# Patient Record
Sex: Female | Born: 1990 | Hispanic: No | Marital: Married | Smoking: Never smoker
Health system: Southern US, Community
[De-identification: ages and names within clinical notes are randomized; demographics above are authoritative.]

## PROBLEM LIST (undated history)

## (undated) ENCOUNTER — Inpatient Hospital Stay (HOSPITAL_COMMUNITY): Payer: Self-pay

## (undated) DIAGNOSIS — Z789 Other specified health status: Secondary | ICD-10-CM

## (undated) DIAGNOSIS — J302 Other seasonal allergic rhinitis: Secondary | ICD-10-CM

## (undated) DIAGNOSIS — J189 Pneumonia, unspecified organism: Secondary | ICD-10-CM

## (undated) DIAGNOSIS — Z6791 Unspecified blood type, Rh negative: Secondary | ICD-10-CM

## (undated) DIAGNOSIS — O26899 Other specified pregnancy related conditions, unspecified trimester: Secondary | ICD-10-CM

## (undated) DIAGNOSIS — O2610 Low weight gain in pregnancy, unspecified trimester: Secondary | ICD-10-CM

## (undated) HISTORY — DX: Low weight gain in pregnancy, unspecified trimester: O26.10

## (undated) HISTORY — PX: NO PAST SURGERIES: SHX2092

## (undated) HISTORY — DX: Unspecified blood type, rh negative: Z67.91

## (undated) HISTORY — DX: Other specified pregnancy related conditions, unspecified trimester: O26.899

---

## 2016-02-04 NOTE — L&D Delivery Note (Signed)
Patient is 26 y.o. G2P1001 2543w3d admitted SOL. S/p augmentation with Pitocin and AROM.  Prenatal course also complicated by Rh negative status and female circumscision.  Delivery Note At 7:33 PM a viable female was delivered via Vaginal, Spontaneous (Presentation: ROA ).  APGAR: 8, 9; weight pending.   Placenta status: Intact.  Cord: 3V  with the following complications: None.  Cord pH: N/A  Anesthesia: Epidural Episiotomy: Median Lacerations: 2nd degree, labial Suture Repair: vicryl monocryl Est. Blood Loss (mL):  500mL  Head was crowing so decision was made to cut Type 3 female circumcision vertically about 1cm. Head still couldn't deliver so decision was made to do a medial episiotomy. Head delivered ROA.  Loose nuchal cord x1 present. Shoulder and body delivered in usual fashion. Infant with spontaneous cry, placed on mother's abdomen, dried and bulb suctioned. Cord clamped x 2 after 1-minute delay, and cut. Cord blood drawn. Placenta delivered spontaneously with gentle cord traction. Fundus firm with massage and Pitocin. Patient continued to have bleeding so Cytotec 1000mg  rectally given with good hemostasis after. Perineum inspected and found to have 2nd degree laceration along episiotomy cut. Also repaired female circumscision after cut. Repaired with good hemostasis achieved.   Caryl AdaJazma Kenyatta Keidel, DO OB Fellow

## 2016-07-05 ENCOUNTER — Inpatient Hospital Stay (HOSPITAL_COMMUNITY): Payer: Medicaid Other

## 2016-07-05 ENCOUNTER — Encounter (HOSPITAL_COMMUNITY): Payer: Self-pay

## 2016-07-05 ENCOUNTER — Inpatient Hospital Stay (HOSPITAL_COMMUNITY)
Admission: AD | Admit: 2016-07-05 | Discharge: 2016-07-05 | Disposition: A | Discharge status: Home / Self Care | Payer: Medicaid Other | Source: Ambulatory Visit | Attending: Obstetrics & Gynecology | Admitting: Obstetrics & Gynecology

## 2016-07-05 DIAGNOSIS — O219 Vomiting of pregnancy, unspecified: Secondary | ICD-10-CM | POA: Insufficient documentation

## 2016-07-05 DIAGNOSIS — O26891 Other specified pregnancy related conditions, first trimester: Secondary | ICD-10-CM | POA: Insufficient documentation

## 2016-07-05 DIAGNOSIS — Z3A09 9 weeks gestation of pregnancy: Secondary | ICD-10-CM | POA: Diagnosis not present

## 2016-07-05 DIAGNOSIS — R109 Unspecified abdominal pain: Secondary | ICD-10-CM | POA: Diagnosis not present

## 2016-07-05 DIAGNOSIS — Z3491 Encounter for supervision of normal pregnancy, unspecified, first trimester: Secondary | ICD-10-CM

## 2016-07-05 DIAGNOSIS — O9989 Other specified diseases and conditions complicating pregnancy, childbirth and the puerperium: Secondary | ICD-10-CM | POA: Diagnosis not present

## 2016-07-05 DIAGNOSIS — O26899 Other specified pregnancy related conditions, unspecified trimester: Secondary | ICD-10-CM

## 2016-07-05 DIAGNOSIS — O2341 Unspecified infection of urinary tract in pregnancy, first trimester: Secondary | ICD-10-CM

## 2016-07-05 HISTORY — DX: Other specified health status: Z78.9

## 2016-07-05 LAB — URINALYSIS, ROUTINE W REFLEX MICROSCOPIC
BILIRUBIN URINE: NEGATIVE
GLUCOSE, UA: NEGATIVE mg/dL
Ketones, ur: NEGATIVE mg/dL
NITRITE: NEGATIVE
Protein, ur: 100 mg/dL — AB
SPECIFIC GRAVITY, URINE: 1.025 (ref 1.005–1.030)
pH: 6 (ref 5.0–8.0)

## 2016-07-05 LAB — CBC
HEMATOCRIT: 36.6 % (ref 36.0–46.0)
HEMOGLOBIN: 12.9 g/dL (ref 12.0–15.0)
MCH: 28.7 pg (ref 26.0–34.0)
MCHC: 35.2 g/dL (ref 30.0–36.0)
MCV: 81.3 fL (ref 78.0–100.0)
Platelets: 309 10*3/uL (ref 150–400)
RBC: 4.5 MIL/uL (ref 3.87–5.11)
RDW: 12.9 % (ref 11.5–15.5)
WBC: 11.9 10*3/uL — ABNORMAL HIGH (ref 4.0–10.5)

## 2016-07-05 LAB — POCT PREGNANCY, URINE: PREG TEST UR: POSITIVE — AB

## 2016-07-05 LAB — HCG, QUANTITATIVE, PREGNANCY: hCG, Beta Chain, Quant, S: 81042 m[IU]/mL — ABNORMAL HIGH (ref <5)

## 2016-07-05 LAB — ABO/RH: ABO/RH(D): AB NEG

## 2016-07-05 MED ORDER — CEPHALEXIN 500 MG PO CAPS: 500.0000 mg | ORAL_CAPSULE | Freq: Four times a day (QID) | ORAL | 0 refills | Status: DC

## 2016-07-05 MED ORDER — PREPLUS 27-1 MG PO TABS: 1.0000 | ORAL_TABLET | Freq: Every day | ORAL | 13 refills | Status: DC

## 2016-07-05 MED ORDER — PROMETHAZINE HCL 25 MG PO TABS: 25.0000 mg | ORAL_TABLET | Freq: Four times a day (QID) | ORAL | 0 refills | Status: DC | PRN

## 2016-07-05 NOTE — Discharge Instructions (Signed)

## 2016-07-05 NOTE — MAU Provider Note (Signed)
History     CSN: [id number]  Arrival date and time: 07/05/16 1358  First Provider Initiated Contact with Patient 07/05/16 1508      Chief Complaint  Patient presents with  . Abdominal Pain   HPI Madison Bishop is a 26 y.o. G2P1001 at [redacted]w[redacted]d by unsure LMP who presents with abdominal pain. Symptoms began this morning. Reports intermittent lower abdominal paint that she describes as sharp. Rates pain 8/10. Has not treated. Also reports nausea & vomiting since finding out she was pregnant; has vomited twice today. Burning with urination for the last few days. Denies hematuria, vaginal bleeding, vaginal discharge, urinary frequency, diarrhea, or constipation.   OB History    Gravida Para Term Preterm AB Living   2 1 1     1    SAB TAB Ectopic Multiple Live Births           1      Past Medical History:  Diagnosis Date  . Medical history non-contributory     Past Surgical History:  Procedure Laterality Date  . NO PAST SURGERIES      No family history on file.  Social History  Substance Use Topics  . Smoking status: Never Smoker  . Smokeless tobacco: Never Used  . Alcohol use No    Allergies: No Known Allergies  Prescriptions Prior to Admission  Medication Sig Dispense Refill Last Dose  . acetaminophen (TYLENOL) 500 MG tablet Take 1,000 mg by mouth every 6 (six) hours as needed for mild pain, moderate pain, fever or headache.   Past Week at Unknown time    Review of Systems  Constitutional: Negative.   Gastrointestinal: Positive for abdominal pain, nausea and vomiting. Negative for constipation and diarrhea.  Genitourinary: Positive for dysuria. Negative for hematuria, vaginal bleeding and vaginal discharge.   Physical Exam   Blood pressure (!) 114/59, pulse 90, temperature 99.1 F (37.3 C), temperature source Oral, resp. rate 16, last menstrual period 05/03/2016.  Physical Exam  Nursing note and vitals reviewed. Constitutional: She is oriented to person,  place, and time. She appears well-developed and well-nourished. No distress.  HENT:  Head: Normocephalic and atraumatic.  Eyes: Conjunctivae are normal. Right eye exhibits no discharge. Left eye exhibits no discharge. No scleral icterus.  Neck: Normal range of motion.  Respiratory: Effort normal. No respiratory distress.  GI: Soft. Bowel sounds are normal. She exhibits no distension. There is no tenderness. There is no rebound and no guarding.  Neurological: She is alert and oriented to person, place, and time.  Skin: Skin is warm and dry. She is not diaphoretic.  Psychiatric: She has a normal mood and affect. Her behavior is normal. Judgment and thought content normal.    MAU Course  Procedures Results for orders placed or performed during the hospital encounter of 07/05/16 (from the past 24 hour(s))  Urinalysis, Routine w reflex microscopic     Status: Abnormal   Collection Time: 07/05/16  2:40 PM  Result Value Ref Range   Color, Urine AMBER (A) YELLOW   APPearance CLOUDY (A) CLEAR   Specific Gravity, Urine 1.025 1.005 - 1.030   pH 6.0 5.0 - 8.0   Glucose, UA NEGATIVE NEGATIVE mg/dL   Hgb urine dipstick SMALL (A) NEGATIVE   Bilirubin Urine NEGATIVE NEGATIVE   Ketones, ur NEGATIVE NEGATIVE mg/dL   Protein, ur 409 (A) NEGATIVE mg/dL   Nitrite NEGATIVE NEGATIVE   Leukocytes, UA SMALL (A) NEGATIVE   RBC / HPF 6-30 0 - 5  RBC/hpf   WBC, UA 6-30 0 - 5 WBC/hpf   Bacteria, UA MANY (A) NONE SEEN   Squamous Epithelial / LPF 6-30 (A) NONE SEEN   Mucous PRESENT   Pregnancy, urine POC     Status: Abnormal   Collection Time: 07/05/16  2:47 PM  Result Value Ref Range   Preg Test, Ur POSITIVE (A) NEGATIVE  CBC     Status: Abnormal   Collection Time: 07/05/16  3:32 PM  Result Value Ref Range   WBC 11.9 (H) 4.0 - 10.5 K/uL   RBC 4.50 3.87 - 5.11 MIL/uL   Hemoglobin 12.9 12.0 - 15.0 g/dL   HCT 16.136.6 09.636.0 - 04.546.0 %   MCV 81.3 78.0 - 100.0 fL   MCH 28.7 26.0 - 34.0 pg   MCHC 35.2 30.0 - 36.0  g/dL   RDW 40.912.9 81.111.5 - 91.415.5 %   Platelets 309 150 - 400 K/uL  ABO/Rh     Status: None (Preliminary result)   Collection Time: 07/05/16  3:32 PM  Result Value Ref Range   ABO/RH(D) AB NEG   hCG, quantitative, pregnancy     Status: Abnormal   Collection Time: 07/05/16  3:32 PM  Result Value Ref Range   hCG, Beta Chain, Quant, S 81,042 (H) <5 mIU/mL   [place] Ob Comp Less 14 Wks  Result Date: 07/05/2016 CLINICAL DATA:  Pregnant patient with abdominal pain. EXAM: OBSTETRIC <14 WK ULTRASOUND TECHNIQUE: Transabdominal ultrasound was performed for evaluation of the gestation as well as the maternal uterus and adnexal regions. COMPARISON:  None. FINDINGS: Intrauterine gestational sac: Single Yolk sac:  Visualized. Embryo:  Visualized. Cardiac Activity: Visualized. Heart Rate: 176 bpm CRL:   40  mm   10 w 6 d                  US EDC: 01/25/2017 Subchorionic hemorrhage:  None visualized. Maternal uterus/adnexae: Normal right and left ovaries. No free fluid in the pelvis. No subchorionic hemorrhage. IMPRESSION: Single live intrauterine gestation.  No subchorionic hemorrhage. Electronically Signed   By: Annia Beltrew  Davis M.D.   On: 07/05/2016 16:18    MDM +UPT UA, CBC, ABO/Rh, quant hCG, HIV, and US today to rule out ectopic pregnancy Ultrasound shows SIUP with cardiac activity Assessment and Plan  A:  1. Normal IUP (intrauterine pregnancy) on prenatal ultrasound, first trimester   2. Abdominal pain affecting pregnancy   3. UTI (urinary tract infection) during pregnancy, first trimester   4. Nausea and vomiting during pregnancy prior to [redacted] weeks gestation    P: Discharge home Rx phenergan, keflex, PNV Discussed reasons to return to MAU Keep follow up appointment with OB/PCP  Urine culture pending   Madison Bishop 07/05/2016, 3:08 PM

## 2016-07-05 NOTE — MAU Note (Signed)
Pt reports severe abdominal pain since this morning; denies bleeding; fasting for [place]

## 2016-07-07 LAB — CULTURE, OB URINE: CULTURE: NO GROWTH

## 2016-07-21 ENCOUNTER — Other Ambulatory Visit (HOSPITAL_COMMUNITY)
Admission: RE | Admit: 2016-07-21 | Discharge: 2016-07-21 | Disposition: A | Discharge status: Home / Self Care | Payer: Medicaid Other | Source: Ambulatory Visit | Attending: Obstetrics and Gynecology | Admitting: Obstetrics and Gynecology

## 2016-07-21 ENCOUNTER — Ambulatory Visit (INDEPENDENT_AMBULATORY_CARE_PROVIDER_SITE_OTHER): Payer: Medicaid Other | Admitting: Obstetrics and Gynecology

## 2016-07-21 DIAGNOSIS — Z6791 Unspecified blood type, Rh negative: Secondary | ICD-10-CM

## 2016-07-21 DIAGNOSIS — O26899 Other specified pregnancy related conditions, unspecified trimester: Secondary | ICD-10-CM

## 2016-07-21 DIAGNOSIS — N90810 Female genital mutilation status, unspecified: Secondary | ICD-10-CM

## 2016-07-21 DIAGNOSIS — Z789 Other specified health status: Secondary | ICD-10-CM

## 2016-07-21 DIAGNOSIS — O09899 Supervision of other high risk pregnancies, unspecified trimester: Secondary | ICD-10-CM

## 2016-07-21 DIAGNOSIS — Z3480 Encounter for supervision of other normal pregnancy, unspecified trimester: Secondary | ICD-10-CM | POA: Diagnosis present

## 2016-07-21 HISTORY — DX: Unspecified blood type, rh negative: Z67.91

## 2016-07-21 HISTORY — DX: Other specified pregnancy related conditions, unspecified trimester: O26.899

## 2016-07-21 NOTE — Progress Notes (Signed)
  Subjective:    Madison Bishop is a G2P1001 8034w1d being seen today for her first obstetrical visit.  Her obstetrical history is significant for previous [place]complicated pregnancy in [place]. Patient does intend to breast feed. Pregnancy history fully reviewed.  Patient reports no complaints.  Vitals:   07/21/16 1444  BP: 96/61  Pulse: 90  Weight: 156 lb (70.8 kg)    HISTORY: OB History  Gravida Para Term Preterm AB Living  2 1 1     1   SAB TAB Ectopic Multiple Live Births          1    # Outcome Date GA Lbr Len/2nd Weight Sex Delivery Anes PTL Lv  2 Current           1 Term 2013     Vag-Spont   LIV     Past Medical History:  Diagnosis Date  . Medical history non-contributory    Past Surgical History:  Procedure Laterality Date  . NO PAST SURGERIES     No family history on file.   Exam    Uterus:   14-week size  Pelvic Exam:    Perineum: No Hemorrhoids, Normal Perineum   Vulva: normal   Vagina:  normal mucosa, normal discharge   pH:    Cervix: multiparous appearance and cervix is closed and long   Adnexa: normal adnexa and no mass, fullness, tenderness   Bony Pelvis: gynecoid  System: Breast:  normal appearance, no masses or tenderness   Skin: normal coloration and turgor, no rashes    Neurologic: oriented, no focal deficits   Extremities: normal strength, tone, and muscle mass   HEENT extra ocular movement intact   Mouth/Teeth mucous membranes moist, pharynx normal without lesions and dental hygiene good   Neck supple and no masses   Cardiovascular: regular rate and rhythm   Respiratory:  chest clear, no wheezing, crepitations, rhonchi, normal symmetric air entry   Abdomen: soft, non-tender; bowel sounds normal; no masses,  no organomegaly   Urinary:       Assessment:    Pregnancy: G2P1001 Patient Active Problem List   Diagnosis Date Noted  . Encounter for supervision of other normal pregnancy, unspecified trimester 07/21/2016  . Rh  negative, antepartum 07/21/2016        Plan:     Initial labs drawn. Prenatal vitamins. Problem list reviewed and updated. Genetic Screening discussed Quad Screen: requested.  Ultrasound discussed; fetal survey: ordered. Patient reports normal pap smear in February with health department- records requested  Follow up in 4 weeks. Arabic interpreter used 50% of 30 min visit spent on counseling and coordination of care.     Jobie Popp 07/21/2016

## 2016-07-21 NOTE — Progress Notes (Signed)
Last Pap 03/2016 Normal

## 2016-07-21 NOTE — Patient Instructions (Addendum)
 Second Trimester of Pregnancy The second trimester is from week 14 through week 27 (months 4 through 6). The second trimester is often a time when you feel your best. Your body has adjusted to being pregnant, and you begin to feel better physically. Usually, morning sickness has lessened or quit completely, you may have more energy, and you may have an increase in appetite. The second trimester is also a time when the fetus is growing rapidly. At the end of the sixth month, the fetus is about 9 inches long and weighs about 1 pounds. You will likely begin to feel the baby move (quickening) between 16 and 20 weeks of pregnancy. Body changes during your second trimester Your body continues to go through many changes during your second trimester. The changes vary from woman to woman.  Your weight will continue to increase. You will notice your lower abdomen bulging out.  You may begin to get stretch marks on your hips, abdomen, and breasts.  You may develop headaches that can be relieved by medicines. The medicines should be approved by your health care provider.  You may urinate more often because the fetus is pressing on your bladder.  You may develop or continue to have heartburn as a result of your pregnancy.  You may develop constipation because certain hormones are causing the muscles that push waste through your intestines to slow down.  You may develop hemorrhoids or swollen, bulging veins (varicose veins).  You may have back pain. This is caused by: ? Weight gain. ? Pregnancy hormones that are relaxing the joints in your pelvis. ? A shift in weight and the muscles that support your balance.  Your breasts will continue to grow and they will continue to become tender.  Your gums may bleed and may be sensitive to brushing and flossing.  Dark spots or blotches (chloasma, mask of pregnancy) may develop on your face. This will likely fade after the baby is born.  A dark line from  your belly button to the pubic area (linea nigra) may appear. This will likely fade after the baby is born.  You may have changes in your hair. These can include thickening of your hair, rapid growth, and changes in texture. Some women also have hair loss during or after pregnancy, or hair that feels dry or thin. Your hair will most likely return to normal after your baby is born.  What to expect at prenatal visits During a routine prenatal visit:  You will be weighed to make sure you and the fetus are growing normally.  Your blood pressure will be taken.  Your abdomen will be measured to track your baby's growth.  The fetal heartbeat will be listened to.  Any test results from the previous visit will be discussed.  Your health care provider may ask you:  How you are feeling.  If you are feeling the baby move.  If you have had any abnormal symptoms, such as leaking fluid, bleeding, severe headaches, or abdominal cramping.  If you are using any tobacco products, including cigarettes, chewing tobacco, and electronic cigarettes.  If you have any questions.  Other tests that may be performed during your second trimester include:  Blood tests that check for: ? Low iron levels (anemia). ? High blood sugar that affects pregnant women (gestational diabetes) between 24 and 28 weeks. ? Rh antibodies. This is to check for a protein on red blood cells (Rh factor).  Urine tests to check for infections, diabetes,   or protein in the urine.  An ultrasound to confirm the proper growth and development of the baby.  An amniocentesis to check for possible genetic problems.  Fetal screens for spina bifida and Down syndrome.  HIV (human immunodeficiency virus) testing. Routine prenatal testing includes screening for HIV, unless you choose not to have this test.  Follow these instructions at home: Medicines  Follow your health care provider's instructions regarding medicine use. Specific  medicines may be either safe or unsafe to take during pregnancy.  Take a prenatal vitamin that contains at least 600 micrograms (mcg) of folic acid.  If you develop constipation, try taking a stool softener if your health care provider approves. Eating and drinking  Eat a balanced diet that includes fresh fruits and vegetables, whole grains, good sources of protein such as meat, eggs, or tofu, and low-fat dairy. Your health care provider will help you determine the amount of weight gain that is right for you.  Avoid raw meat and uncooked cheese. These carry germs that can cause birth defects in the baby.  If you have low calcium intake from food, talk to your health care provider about whether you should take a daily calcium supplement.  Limit foods that are high in fat and processed sugars, such as fried and sweet foods.  To prevent constipation: ? Drink enough fluid to keep your urine clear or pale yellow. ? Eat foods that are high in fiber, such as fresh fruits and vegetables, whole grains, and beans. Activity  Exercise only as directed by your health care provider. Most women can continue their usual exercise routine during pregnancy. Try to exercise for 30 minutes at least 5 days a week. Stop exercising if you experience uterine contractions.  Avoid heavy lifting, wear low heel shoes, and practice good posture.  A sexual relationship may be continued unless your health care provider directs you otherwise. Relieving pain and discomfort  Wear a good support bra to prevent discomfort from breast tenderness.  Take warm sitz baths to soothe any pain or discomfort caused by hemorrhoids. Use hemorrhoid cream if your health care provider approves.  Rest with your legs elevated if you have leg cramps or low back pain.  If you develop varicose veins, wear support hose. Elevate your feet for 15 minutes, 3-4 times a day. Limit salt in your diet. Prenatal Care  Write down your questions.  Take them to your prenatal visits.  Keep all your prenatal visits as told by your health care provider. This is important. Safety  Wear your seat belt at all times when driving.  Make a list of emergency phone numbers, including numbers for family, friends, the hospital, and police and fire departments. General instructions  Ask your health care provider for a referral to a local prenatal education class. Begin classes no later than the beginning of month 6 of your pregnancy.  Ask for help if you have counseling or nutritional needs during pregnancy. Your health care provider can offer advice or refer you to specialists for help with various needs.  Do not use hot tubs, steam rooms, or saunas.  Do not douche or use tampons or scented sanitary pads.  Do not cross your legs for long periods of time.  Avoid cat litter boxes and soil used by cats. These carry germs that can cause birth defects in the baby and possibly loss of the fetus by miscarriage or stillbirth.  Avoid all smoking, herbs, alcohol, and unprescribed drugs. Chemicals in these products   can affect the formation and growth of the baby.  Do not use any products that contain nicotine or tobacco, such as cigarettes and e-cigarettes. If you need help quitting, ask your health care provider.  Visit your dentist if you have not gone yet during your pregnancy. Use a soft toothbrush to brush your teeth and be gentle when you floss. Contact a health care provider if:  You have dizziness.  You have mild pelvic cramps, pelvic pressure, or nagging pain in the abdominal area.  You have persistent nausea, vomiting, or diarrhea.  You have a bad smelling vaginal discharge.  You have pain when you urinate. Get help right away if:  You have a fever.  You are leaking fluid from your vagina.  You have spotting or bleeding from your vagina.  You have severe abdominal cramping or pain.  You have rapid weight gain or weight  loss.  You have shortness of breath with chest pain.  You notice sudden or extreme swelling of your face, hands, ankles, feet, or legs.  You have not felt your baby move in over an hour.  You have severe headaches that do not go away when you take medicine.  You have vision changes. Summary  The second trimester is from week 14 through week 27 (months 4 through 6). It is also a time when the fetus is growing rapidly.  Your body goes through many changes during pregnancy. The changes vary from woman to woman.  Avoid all smoking, herbs, alcohol, and unprescribed drugs. These chemicals affect the formation and growth your baby.  Do not use any tobacco products, such as cigarettes, chewing tobacco, and e-cigarettes. If you need help quitting, ask your health care provider.  Contact your health care provider if you have any questions. Keep all prenatal visits as told by your health care provider. This is important. This information is not intended to replace advice given to you by your health care provider. Make sure you discuss any questions you have with your health care provider. Document Released: 01/14/2001 Document Revised: 06/28/2015 Document Reviewed: 03/23/2012 [employer] Interactive Patient Education  2017 [employer]  Contraception Choices Contraception (birth control) is the use of any methods or devices to prevent pregnancy. Below are some methods to help avoid pregnancy. Hormonal methods  Contraceptive implant. This is a thin, plastic tube containing progesterone hormone. It does not contain estrogen hormone. Your health care provider inserts the tube in the inner part of the upper arm. The tube can remain in place for up to 3 years. After 3 years, the implant must be removed. The implant prevents the ovaries from releasing an egg (ovulation), thickens the cervical mucus to prevent sperm from entering the uterus, and thins the lining of the inside of the  uterus.  Progesterone-only injections. These injections are given every 3 months by your health care provider to prevent pregnancy. This synthetic progesterone hormone stops the ovaries from releasing eggs. It also thickens cervical mucus and changes the uterine lining. This makes it harder for sperm to survive in the uterus.  Birth control pills. These pills contain estrogen and progesterone hormone. They work by preventing the ovaries from releasing eggs (ovulation). They also cause the cervical mucus to thicken, preventing the sperm from entering the uterus. Birth control pills are prescribed by a health care provider.Birth control pills can also be used to treat heavy periods.  Minipill. This type of birth control pill contains only the progesterone hormone. They are taken every day   of each month and must be prescribed by your health care provider.  Birth control patch. The patch contains hormones similar to those in birth control pills. It must be changed once a week and is prescribed by a health care provider.  Vaginal ring. The ring contains hormones similar to those in birth control pills. It is left in the vagina for 3 weeks, removed for 1 week, and then a new one is put back in place. The patient must be comfortable inserting and removing the ring from the vagina.A health care provider's prescription is necessary.  Emergency contraception. Emergency contraceptives prevent pregnancy after unprotected sexual intercourse. This pill can be taken right after sex or up to 5 days after unprotected sex. It is most effective the sooner you take the pills after having sexual intercourse. Most emergency contraceptive pills are available without a prescription. Check with your pharmacist. Do not use emergency contraception as your only form of birth control. Barrier methods  Female condom. This is a thin sheath (latex or rubber) that is worn over the penis during sexual intercourse. It can be used with  spermicide to increase effectiveness.  Female condom. This is a soft, loose-fitting sheath that is put into the vagina before sexual intercourse.  Diaphragm. This is a soft, latex, dome-shaped barrier that must be fitted by a health care provider. It is inserted into the vagina, along with a spermicidal jelly. It is inserted before intercourse. The diaphragm should be left in the vagina for 6 to 8 hours after intercourse.  Cervical cap. This is a round, soft, latex or plastic cup that fits over the cervix and must be fitted by a health care provider. The cap can be left in place for up to 48 hours after intercourse.  Sponge. This is a soft, circular piece of polyurethane foam. The sponge has spermicide in it. It is inserted into the vagina after wetting it and before sexual intercourse.  Spermicides. These are chemicals that kill or block sperm from entering the cervix and uterus. They come in the form of creams, jellies, suppositories, foam, or tablets. They do not require a prescription. They are inserted into the vagina with an applicator before having sexual intercourse. The process must be repeated every time you have sexual intercourse. Intrauterine contraception  Intrauterine device (IUD). This is a T-shaped device that is put in a woman's uterus during a menstrual period to prevent pregnancy. There are 2 types: ? Copper IUD. This type of IUD is wrapped in copper wire and is placed inside the uterus. Copper makes the uterus and fallopian tubes produce a fluid that kills sperm. It can stay in place for 10 years. ? Hormone IUD. This type of IUD contains the hormone progestin (synthetic progesterone). The hormone thickens the cervical mucus and prevents sperm from entering the uterus, and it also thins the uterine lining to prevent implantation of a fertilized egg. The hormone can weaken or kill the sperm that get into the uterus. It can stay in place for 3-5 years, depending on which type of IUD  is used. Permanent methods of contraception  Female tubal ligation. This is when the woman's fallopian tubes are surgically sealed, tied, or blocked to prevent the egg from traveling to the uterus.  Hysteroscopic sterilization. This involves placing a small coil or insert into each fallopian tube. Your doctor uses a technique called hysteroscopy to do the procedure. The device causes scar tissue to form. This results in permanent blockage of the fallopian   tubes, so the sperm cannot fertilize the egg. It takes about 3 months after the procedure for the tubes to become blocked. You must use another form of birth control for these 3 months.  Female sterilization. This is when the female has the tubes that carry sperm tied off (vasectomy).This blocks sperm from entering the vagina during sexual intercourse. After the procedure, the man can still ejaculate fluid (semen). Natural planning methods  Natural family planning. This is not having sexual intercourse or using a barrier method (condom, diaphragm, cervical cap) on days the woman could become pregnant.  Calendar method. This is keeping track of the length of each menstrual cycle and identifying when you are fertile.  Ovulation method. This is avoiding sexual intercourse during ovulation.  Symptothermal method. This is avoiding sexual intercourse during ovulation, using a thermometer and ovulation symptoms.  Post-ovulation method. This is timing sexual intercourse after you have ovulated. Regardless of which type or method of contraception you choose, it is important that you use condoms to protect against the transmission of sexually transmitted infections (STIs). Talk with your health care provider about which form of contraception is most appropriate for you. This information is not intended to replace advice given to you by your health care provider. Make sure you discuss any questions you have with your health care provider. Document Released:  01/20/2005 Document Revised: 06/28/2015 Document Reviewed: 07/15/2012 [employer] Interactive Patient Education  2017 [employer]   Breastfeeding Deciding to breastfeed is one of the best choices you can make for you and your baby. A change in hormones during pregnancy causes your breast tissue to grow and increases the number and size of your milk ducts. These hormones also allow proteins, sugars, and fats from your blood supply to make breast milk in your milk-producing glands. Hormones prevent breast milk from being released before your baby is born as well as prompt milk flow after birth. Once breastfeeding has begun, thoughts of your baby, as well as his or her sucking or crying, can stimulate the release of milk from your milk-producing glands. Benefits of breastfeeding For Your Baby  Your first milk (colostrum) helps your baby's digestive system function better.  There are antibodies in your milk that help your baby fight off infections.  Your baby has a lower incidence of asthma, allergies, and sudden infant death syndrome.  The nutrients in breast milk are better for your baby than infant formulas and are designed uniquely for your baby's needs.  Breast milk improves your baby's brain development.  Your baby is less likely to develop other conditions, such as childhood obesity, asthma, or type 2 diabetes mellitus.  For You  Breastfeeding helps to create a very special bond between you and your baby.  Breastfeeding is convenient. Breast milk is always available at the correct temperature and costs nothing.  Breastfeeding helps to burn calories and helps you lose the weight gained during pregnancy.  Breastfeeding makes your uterus contract to its prepregnancy size faster and slows bleeding (lochia) after you give birth.  Breastfeeding helps to lower your risk of developing type 2 diabetes mellitus, osteoporosis, and breast or ovarian cancer later in life.  Signs that your baby  is hungry Early Signs of Hunger  Increased alertness or activity.  Stretching.  Movement of the head from side to side.  Movement of the head and opening of the mouth when the corner of the mouth or cheek is stroked (rooting).  Increased sucking sounds, smacking lips, cooing, sighing, or   squeaking.  Hand-to-mouth movements.  Increased sucking of fingers or hands.  Late Signs of Hunger  Fussing.  Intermittent crying.  Extreme Signs of Hunger Signs of extreme hunger will require calming and consoling before your baby will be able to breastfeed successfully. Do not wait for the following signs of extreme hunger to occur before you initiate breastfeeding:  Restlessness.  A loud, strong cry.  Screaming.  Breastfeeding basics Breastfeeding Initiation  Find a comfortable place to sit or lie down, with your neck and back well supported.  Place a pillow or rolled up blanket under your baby to bring him or her to the level of your breast (if you are seated). Nursing pillows are specially designed to help support your arms and your baby while you breastfeed.  Make sure that your baby's abdomen is facing your abdomen.  Gently massage your breast. With your fingertips, massage from your chest wall toward your nipple in a circular motion. This encourages milk flow. You may need to continue this action during the feeding if your milk flows slowly.  Support your breast with 4 fingers underneath and your thumb above your nipple. Make sure your fingers are well away from your nipple and your baby's mouth.  Stroke your baby's lips gently with your finger or nipple.  When your baby's mouth is open wide enough, quickly bring your baby to your breast, placing your entire nipple and as much of the colored area around your nipple (areola) as possible into your baby's mouth. ? More areola should be visible above your baby's upper lip than below the lower lip. ? Your baby's tongue should be  between his or her lower gum and your breast.  Ensure that your baby's mouth is correctly positioned around your nipple (latched). Your baby's lips should create a seal on your breast and be turned out (everted).  It is common for your baby to suck about 2-3 minutes in order to start the flow of breast milk.  Latching Teaching your baby how to latch on to your breast properly is very important. An improper latch can cause nipple pain and decreased milk supply for you and poor weight gain in your baby. Also, if your baby is not latched onto your nipple properly, he or she may swallow some air during feeding. This can make your baby fussy. Burping your baby when you switch breasts during the feeding can help to get rid of the air. However, teaching your baby to latch on properly is still the best way to prevent fussiness from swallowing air while breastfeeding. Signs that your baby has successfully latched on to your nipple:  Silent tugging or silent sucking, without causing you pain.  Swallowing heard between every 3-4 sucks.  Muscle movement above and in front of his or her ears while sucking.  Signs that your baby has not successfully latched on to nipple:  Sucking sounds or smacking sounds from your baby while breastfeeding.  Nipple pain.  If you think your baby has not latched on correctly, slip your finger into the corner of your baby's mouth to break the suction and place it between your baby's gums. Attempt breastfeeding initiation again. Signs of Successful Breastfeeding Signs from your baby:  A gradual decrease in the number of sucks or complete cessation of sucking.  Falling asleep.  Relaxation of his or her body.  Retention of a small amount of milk in his or her mouth.  Letting go of your breast by himself or herself.    Signs from you:  Breasts that have increased in firmness, weight, and size 1-3 hours after feeding.  Breasts that are softer immediately after  breastfeeding.  Increased milk volume, as well as a change in milk consistency and color by the fifth day of breastfeeding.  Nipples that are not sore, cracked, or bleeding.  Signs That Your Baby is Getting Enough Milk  Wetting at least 1-2 diapers during the first 24 hours after birth.  Wetting at least 5-6 diapers every 24 hours for the first week after birth. The urine should be clear or pale yellow by 5 days after birth.  Wetting 6-8 diapers every 24 hours as your baby continues to grow and develop.  At least 3 stools in a 24-hour period by age 5 days. The stool should be soft and yellow.  At least 3 stools in a 24-hour period by age 7 days. The stool should be seedy and yellow.  No loss of weight greater than 10% of birth weight during the first 3 days of age.  Average weight gain of 4-7 ounces (113-198 g) per week after age 4 days.  Consistent daily weight gain by age 5 days, without weight loss after the age of 2 weeks.  After a feeding, your baby may spit up a small amount. This is common. Breastfeeding frequency and duration Frequent feeding will help you make more milk and can prevent sore nipples and breast engorgement. Breastfeed when you feel the need to reduce the fullness of your breasts or when your baby shows signs of hunger. This is called "breastfeeding on demand." Avoid introducing a pacifier to your baby while you are working to establish breastfeeding (the first 4-6 weeks after your baby is born). After this time you may choose to use a pacifier. Research has shown that pacifier use during the first year of a baby's life decreases the risk of sudden infant death syndrome (SIDS). Allow your baby to feed on each breast as long as he or she wants. Breastfeed until your baby is finished feeding. When your baby unlatches or falls asleep while feeding from the first breast, offer the second breast. Because newborns are often sleepy in the first few weeks of life, you may  need to awaken your baby to get him or her to feed. Breastfeeding times will vary from baby to baby. However, the following rules can serve as a guide to help you ensure that your baby is properly fed:  Newborns (babies 4 weeks of age or younger) may breastfeed every 1-3 hours.  Newborns should not go longer than 3 hours during the day or 5 hours during the night without breastfeeding.  You should breastfeed your baby a minimum of 8 times in a 24-hour period until you begin to introduce solid foods to your baby at around 6 months of age.  Breast milk pumping Pumping and storing breast milk allows you to ensure that your baby is exclusively fed your breast milk, even at times when you are unable to breastfeed. This is especially important if you are going back to work while you are still breastfeeding or when you are not able to be present during feedings. Your lactation consultant can give you guidelines on how long it is safe to store breast milk. A breast pump is a machine that allows you to pump milk from your breast into a sterile bottle. The pumped breast milk can then be stored in a refrigerator or freezer. Some breast pumps are operated by   hand, while others use electricity. Ask your lactation consultant which type will work best for you. Breast pumps can be purchased, but some hospitals and breastfeeding support groups lease breast pumps on a monthly basis. A lactation consultant can teach you how to hand express breast milk, if you prefer not to use a pump. Caring for your breasts while you breastfeed Nipples can become dry, cracked, and sore while breastfeeding. The following recommendations can help keep your breasts moisturized and healthy:  Avoid using soap on your nipples.  Wear a supportive bra. Although not required, special nursing bras and tank tops are designed to allow access to your breasts for breastfeeding without taking off your entire bra or top. Avoid wearing  underwire-style bras or extremely tight bras.  Air dry your nipples for 3-4minutes after each feeding.  Use only cotton bra pads to absorb leaked breast milk. Leaking of breast milk between feedings is normal.  Use lanolin on your nipples after breastfeeding. Lanolin helps to maintain your skin's normal moisture barrier. If you use pure lanolin, you do not need to wash it off before feeding your baby again. Pure lanolin is not toxic to your baby. You may also hand express a few drops of breast milk and gently massage that milk into your nipples and allow the milk to air dry.  In the first few weeks after giving birth, some women experience extremely full breasts (engorgement). Engorgement can make your breasts feel heavy, warm, and tender to the touch. Engorgement peaks within 3-5 days after you give birth. The following recommendations can help ease engorgement:  Completely empty your breasts while breastfeeding or pumping. You may want to start by applying warm, moist heat (in the shower or with warm water-soaked hand towels) just before feeding or pumping. This increases circulation and helps the milk flow. If your baby does not completely empty your breasts while breastfeeding, pump any extra milk after he or she is finished.  Wear a snug bra (nursing or regular) or tank top for 1-2 days to signal your body to slightly decrease milk production.  Apply ice packs to your breasts, unless this is too uncomfortable for you.  Make sure that your baby is latched on and positioned properly while breastfeeding.  If engorgement persists after 48 hours of following these recommendations, contact your health care provider or a lactation consultant. Overall health care recommendations while breastfeeding  Eat healthy foods. Alternate between meals and snacks, eating 3 of each per day. Because what you eat affects your breast milk, some of the foods may make your baby more irritable than usual. Avoid  eating these foods if you are sure that they are negatively affecting your baby.  Drink milk, fruit juice, and water to satisfy your thirst (about 10 glasses a day).  Rest often, relax, and continue to take your prenatal vitamins to prevent fatigue, stress, and anemia.  Continue breast self-awareness checks.  Avoid chewing and smoking tobacco. Chemicals from cigarettes that pass into breast milk and exposure to secondhand smoke may harm your baby.  Avoid alcohol and drug use, including marijuana. Some medicines that may be harmful to your baby can pass through breast milk. It is important to ask your health care provider before taking any medicine, including all over-the-counter and prescription medicine as well as vitamin and herbal supplements. It is possible to become pregnant while breastfeeding. If birth control is desired, ask your health care provider about options that will be safe for your baby. Contact   a health care provider if:  You feel like you want to stop breastfeeding or have become frustrated with breastfeeding.  You have painful breasts or nipples.  Your nipples are cracked or bleeding.  Your breasts are red, tender, or warm.  You have a swollen area on either breast.  You have a fever or chills.  You have nausea or vomiting.  You have drainage other than breast milk from your nipples.  Your breasts do not become full before feedings by the fifth day after you give birth.  You feel sad and depressed.  Your baby is too sleepy to eat well.  Your baby is having trouble sleeping.  Your baby is wetting less than 3 diapers in a 24-hour period.  Your baby has less than 3 stools in a 24-hour period.  Your baby's skin or the white part of his or her eyes becomes yellow.  Your baby is not gaining weight by 5 days of age. Get help right away if:  Your baby is overly tired (lethargic) and does not want to wake up and feed.  Your baby develops an unexplained  fever. This information is not intended to replace advice given to you by your health care provider. Make sure you discuss any questions you have with your health care provider. Document Released: 01/20/2005 Document Revised: 07/04/2015 Document Reviewed: 07/14/2012 Elsevier Interactive Patient Education  2017 Elsevier Inc.  

## 2016-07-23 LAB — CERVICOVAGINAL ANCILLARY ONLY
Bacterial vaginitis: NEGATIVE
CANDIDA VAGINITIS: NEGATIVE
CHLAMYDIA, DNA PROBE: NEGATIVE
NEISSERIA GONORRHEA: NEGATIVE
TRICH (WINDOWPATH): NEGATIVE

## 2016-07-23 LAB — OBSTETRIC PANEL, INCLUDING HIV
Antibody Screen: NEGATIVE
Basophils Absolute: 0 10*3/uL (ref 0.0–0.2)
Basos: 0 %
EOS (ABSOLUTE): 0.1 10*3/uL (ref 0.0–0.4)
EOS: 1 %
HEMOGLOBIN: 12.1 g/dL (ref 11.1–15.9)
HEP B S AG: NEGATIVE
HIV Screen 4th Generation wRfx: NONREACTIVE
Hematocrit: 36.8 % (ref 34.0–46.6)
IMMATURE GRANS (ABS): 0 10*3/uL (ref 0.0–0.1)
IMMATURE GRANULOCYTES: 0 %
LYMPHS: 30 %
Lymphocytes Absolute: 2 10*3/uL (ref 0.7–3.1)
MCH: 28.5 pg (ref 26.6–33.0)
MCHC: 32.9 g/dL (ref 31.5–35.7)
MCV: 87 fL (ref 79–97)
MONOCYTES: 7 %
Monocytes Absolute: 0.5 10*3/uL (ref 0.1–0.9)
NEUTROS PCT: 62 %
Neutrophils Absolute: 4.2 10*3/uL (ref 1.4–7.0)
Platelets: 330 10*3/uL (ref 150–379)
RBC: 4.25 x10E6/uL (ref 3.77–5.28)
RDW: 14 % (ref 12.3–15.4)
RH TYPE: NEGATIVE
RPR Ser Ql: NONREACTIVE
Rubella Antibodies, IGG: 27.9 index (ref >0.99)
WBC: 6.9 10*3/uL (ref 3.4–10.8)

## 2016-07-23 LAB — HEMOGLOBINOPATHY EVALUATION
HEMOGLOBIN A2 QUANTITATION: 2.6 % (ref 1.8–3.2)
HEMOGLOBIN F QUANTITATION: 0 % (ref 0.0–2.0)
HGB C: 0 %
HGB S: 0 %
HGB VARIANT: 0 %
Hgb A: 97.4 % (ref 96.4–98.8)

## 2016-07-23 LAB — VARICELLA ZOSTER ANTIBODY, IGG

## 2016-07-23 LAB — URINE CULTURE, OB REFLEX: Organism ID, Bacteria: NO GROWTH

## 2016-07-23 LAB — CULTURE, OB URINE

## 2016-07-28 ENCOUNTER — Encounter: Payer: Self-pay | Admitting: Obstetrics and Gynecology

## 2016-07-28 DIAGNOSIS — Z283 Underimmunization status: Secondary | ICD-10-CM

## 2016-07-28 DIAGNOSIS — O09899 Supervision of other high risk pregnancies, unspecified trimester: Secondary | ICD-10-CM | POA: Insufficient documentation

## 2016-07-28 DIAGNOSIS — Z2839 Other underimmunization status: Secondary | ICD-10-CM | POA: Insufficient documentation

## 2016-07-28 LAB — CYSTIC FIBROSIS MUTATION 97: GENE DIS ANAL CARRIER INTERP BLD/T-IMP: NOT DETECTED

## 2016-08-20 ENCOUNTER — Ambulatory Visit (INDEPENDENT_AMBULATORY_CARE_PROVIDER_SITE_OTHER): Payer: Medicaid Other | Admitting: Certified Nurse Midwife

## 2016-08-20 VITALS — BP 101/63 | HR 83 | Wt 144.6 lb

## 2016-08-20 DIAGNOSIS — Z6791 Unspecified blood type, Rh negative: Secondary | ICD-10-CM

## 2016-08-20 DIAGNOSIS — O09899 Supervision of other high risk pregnancies, unspecified trimester: Secondary | ICD-10-CM

## 2016-08-20 DIAGNOSIS — Z283 Underimmunization status: Secondary | ICD-10-CM

## 2016-08-20 DIAGNOSIS — Z3480 Encounter for supervision of other normal pregnancy, unspecified trimester: Secondary | ICD-10-CM

## 2016-08-20 DIAGNOSIS — Z789 Other specified health status: Secondary | ICD-10-CM

## 2016-08-20 DIAGNOSIS — O26892 Other specified pregnancy related conditions, second trimester: Secondary | ICD-10-CM

## 2016-08-20 DIAGNOSIS — O26899 Other specified pregnancy related conditions, unspecified trimester: Secondary | ICD-10-CM

## 2016-08-20 DIAGNOSIS — Z3482 Encounter for supervision of other normal pregnancy, second trimester: Secondary | ICD-10-CM | POA: Diagnosis not present

## 2016-08-20 DIAGNOSIS — R51 Headache: Secondary | ICD-10-CM

## 2016-08-20 MED ORDER — BUTALBITAL-APAP-CAFFEINE 50-325-40 MG PO TABS: 1.0000 | ORAL_TABLET | Freq: Four times a day (QID) | ORAL | 4 refills | Status: DC | PRN

## 2016-08-20 NOTE — Progress Notes (Signed)
Patient reports feeling fetal movement, denies pain. 

## 2016-08-20 NOTE — Progress Notes (Signed)
   PRENATAL VISIT NOTE  Subjective:  Madison Bishop is a 26 y.o. G2P1001 at 6172w3d being seen today for ongoing prenatal care.  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  Patient reports headache, no bleeding, no contractions, no cramping and no leaking.  Contractions: Not present. Vag. Bleeding: None.  Movement: Present. Denies leaking of fluid.   The following portions of the patient's history were reviewed and updated as appropriate: allergies, current medications, past family history, past medical history, past social history, past surgical history and problem list. Problem list updated.  Objective:   Vitals:   08/20/16 1421  BP: 101/63  Pulse: 83  Weight: 144 lb 9.6 oz (65.6 kg)    Fetal Status: Fetal Heart Rate (bpm): 154; doppler   Movement: Present     General:  Alert, oriented and cooperative. Patient is in no acute distress.  Skin: Skin is warm and dry. No rash noted.   Cardiovascular: Normal heart rate noted  Respiratory: Normal respiratory effort, no problems with respiration noted  Abdomen: Soft, gravid, appropriate for gestational age.  Pain/Pressure: Absent     Pelvic: Cervical exam deferred        Extremities: Normal range of motion.  Edema: None  Mental Status:  Normal mood and affect. Normal behavior. Normal judgment and thought content.   Assessment and Plan:  Pregnancy: G2P1001 at 3172w3d  1. Encounter for supervision of other normal pregnancy, unspecified trimester        - AFP, Serum, Open Spina Bifida  2. Rh negative, antepartum     Rhogam at 28 weeks  3. Language barrier     Video interpreter used  4. Susceptible to varicella (non-immune), currently pregnant   Varicella postpartum  5. Headache in pregnancy, antepartum, second trimester     Has tried OTC tylenol - butalbital-acetaminophen-caffeine (FIORICET, ESGIC) 50-325-40 MG tablet; Take 1-2 tablets by mouth every 6 (six) hours as needed.  Dispense: 45 tablet; Refill: 4  Preterm labor symptoms and general obstetric precautions including but not limited to vaginal bleeding, contractions, leaking of fluid and fetal movement were reviewed in detail with the patient. Please refer to After Visit Summary for other counseling recommendations.  Return in about 4 weeks (around 09/17/2016) for ROB.   Roe Coombsachelle A Layson Bertsch, CNM

## 2016-08-27 ENCOUNTER — Other Ambulatory Visit: Payer: Self-pay | Admitting: Certified Nurse Midwife

## 2016-08-27 DIAGNOSIS — Z3480 Encounter for supervision of other normal pregnancy, unspecified trimester: Secondary | ICD-10-CM

## 2016-08-27 LAB — AFP, SERUM, OPEN SPINA BIFIDA
AFP MoM: 1.25
AFP VALUE AFPOSL: 49.6 ng/mL
GEST. AGE ON COLLECTION DATE: 17.3 wk
Maternal Age At EDD: 27.9 yr
OSBR Risk 1 IN: 5576
Test Results:: NEGATIVE
WEIGHT: 145 [lb_av]

## 2016-09-01 ENCOUNTER — Ambulatory Visit (HOSPITAL_COMMUNITY)
Admission: RE | Admit: 2016-09-01 | Discharge: 2016-09-01 | Disposition: A | Discharge status: Home / Self Care | Payer: Medicaid Other | Source: Ambulatory Visit | Attending: Obstetrics and Gynecology | Admitting: Obstetrics and Gynecology

## 2016-09-01 DIAGNOSIS — Z3482 Encounter for supervision of other normal pregnancy, second trimester: Secondary | ICD-10-CM | POA: Insufficient documentation

## 2016-09-01 DIAGNOSIS — Z3A19 19 weeks gestation of pregnancy: Secondary | ICD-10-CM | POA: Diagnosis not present

## 2016-09-01 DIAGNOSIS — Z3480 Encounter for supervision of other normal pregnancy, unspecified trimester: Secondary | ICD-10-CM

## 2016-09-17 ENCOUNTER — Encounter: Payer: Self-pay | Admitting: Certified Nurse Midwife

## 2016-09-17 ENCOUNTER — Ambulatory Visit (INDEPENDENT_AMBULATORY_CARE_PROVIDER_SITE_OTHER): Payer: Medicaid Other | Admitting: Certified Nurse Midwife

## 2016-09-17 VITALS — BP 102/63 | HR 70 | Wt 148.0 lb

## 2016-09-17 DIAGNOSIS — Z789 Other specified health status: Secondary | ICD-10-CM

## 2016-09-17 DIAGNOSIS — Z6791 Unspecified blood type, Rh negative: Secondary | ICD-10-CM

## 2016-09-17 DIAGNOSIS — O26899 Other specified pregnancy related conditions, unspecified trimester: Secondary | ICD-10-CM

## 2016-09-17 DIAGNOSIS — O09899 Supervision of other high risk pregnancies, unspecified trimester: Secondary | ICD-10-CM

## 2016-09-17 DIAGNOSIS — Z283 Underimmunization status: Secondary | ICD-10-CM

## 2016-09-17 DIAGNOSIS — Z3480 Encounter for supervision of other normal pregnancy, unspecified trimester: Secondary | ICD-10-CM

## 2016-09-17 DIAGNOSIS — Z2839 Other underimmunization status: Secondary | ICD-10-CM

## 2016-09-17 DIAGNOSIS — Z3483 Encounter for supervision of other normal pregnancy, third trimester: Secondary | ICD-10-CM

## 2016-09-17 MED ORDER — PROVIDA DHA 16-16-1.25-110 MG PO CAPS: 1.0000 | ORAL_CAPSULE | Freq: Every day | ORAL | 12 refills | Status: DC

## 2016-09-17 NOTE — Progress Notes (Signed)
   PRENATAL VISIT NOTE  Subjective:  Madison Bishop is a 26 y.o. G2P1001 at 6767w3d being seen today for ongoing prenatal care.  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  Patient reports no complaints.  Contractions: Not present. Vag. Bleeding: None.  Movement: Present. Denies leaking of fluid.   The following portions of the patient's history were reviewed and updated as appropriate: allergies, current medications, past family history, past medical history, past social history, past surgical history and problem list. Problem list updated.  Objective:   Vitals:   09/17/16 1508  BP: 102/63  Pulse: 70  Weight: 148 lb (67.1 kg)    Fetal Status: Fetal Heart Rate (bpm): 152; doppler Fundal Height: 22 cm Movement: Present     General:  Alert, oriented and cooperative. Patient is in no acute distress.  Skin: Skin is warm and dry. No rash noted.   Cardiovascular: Normal heart rate noted  Respiratory: Normal respiratory effort, no problems with respiration noted  Abdomen: Soft, gravid, appropriate for gestational age.  Pain/Pressure: Absent     Pelvic: Cervical exam deferred        Extremities: Normal range of motion.  Edema: None  Mental Status:  Normal mood and affect. Normal behavior. Normal judgment and thought content.   Assessment and Plan:  Pregnancy: G2P1001 at 5767w3d  1. Rh negative, antepartum     Rhogam at 28 weeks  2. Language barrier     Interpreter refused  3. Susceptible to varicella (non-immune), currently pregnant     Varicella postaprtum  4. Encounter for supervision of other normal pregnancy, unspecified trimester     Doing well.  Preterm labor symptoms and general  obstetric precautions including but not limited to vaginal bleeding, contractions, leaking of fluid and fetal movement were reviewed in detail with the patient. Please refer to After Visit Summary for other counseling recommendations.  Return in about 4 weeks (around 10/15/2016) for ROB.   Roe Coombsachelle A Enza Shone, CNM

## 2016-10-15 ENCOUNTER — Ambulatory Visit (INDEPENDENT_AMBULATORY_CARE_PROVIDER_SITE_OTHER): Payer: Medicaid Other | Admitting: Certified Nurse Midwife

## 2016-10-15 VITALS — BP 103/61 | HR 86 | Wt 150.0 lb

## 2016-10-15 DIAGNOSIS — Z283 Underimmunization status: Secondary | ICD-10-CM

## 2016-10-15 DIAGNOSIS — Z3480 Encounter for supervision of other normal pregnancy, unspecified trimester: Secondary | ICD-10-CM

## 2016-10-15 DIAGNOSIS — Z23 Encounter for immunization: Secondary | ICD-10-CM | POA: Diagnosis not present

## 2016-10-15 DIAGNOSIS — O26899 Other specified pregnancy related conditions, unspecified trimester: Principal | ICD-10-CM

## 2016-10-15 DIAGNOSIS — O09892 Supervision of other high risk pregnancies, second trimester: Secondary | ICD-10-CM

## 2016-10-15 DIAGNOSIS — O09899 Supervision of other high risk pregnancies, unspecified trimester: Secondary | ICD-10-CM

## 2016-10-15 DIAGNOSIS — Z789 Other specified health status: Secondary | ICD-10-CM

## 2016-10-15 DIAGNOSIS — Z6791 Unspecified blood type, Rh negative: Secondary | ICD-10-CM

## 2016-10-15 MED ORDER — PREPLUS 27-1 MG PO TABS: 1.0000 | ORAL_TABLET | Freq: Every day | ORAL | 13 refills | Status: DC

## 2016-10-15 NOTE — Progress Notes (Signed)
   PRENATAL VISIT NOTE  Subjective:  Madison Bishop is a 26 y.o. G2P1001 at 6626w3d being seen today for ongoing prenatal care.  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  Patient reports no complaints.  Contractions: Not present. Vag. Bleeding: None.  Movement: Present. Denies leaking of fluid.   The following portions of the patient's history were reviewed and updated as appropriate: allergies, current medications, past family history, past medical history, past social history, past surgical history and problem list. Problem list updated.  Objective:   Vitals:   10/15/16 1414  BP: 103/61  Pulse: 86  Weight: 150 lb (68 kg)    Fetal Status: Fetal Heart Rate (bpm): 146; doppler Fundal Height: 25 cm Movement: Present     General:  Alert, oriented and cooperative. Patient is in no acute distress.  Skin: Skin is warm and dry. No rash noted.   Cardiovascular: Normal heart rate noted  Respiratory: Normal respiratory effort, no problems with respiration noted  Abdomen: Soft, gravid, appropriate for gestational age.  Pain/Pressure: Absent     Pelvic: Cervical exam deferred        Extremities: Normal range of motion.  Edema: None  Mental Status:  Normal mood and affect. Normal behavior. Normal judgment and thought content.   Assessment and Plan:  Pregnancy: G2P1001 at 726w3d  1. Encounter for supervision of other normal pregnancy, unspecified trimester     Doing well - Flu Vaccine QUAD 36+ mos IM (Fluarix, Quad PF) - Prenatal Vit-Fe Fumarate-FA (PREPLUS) 27-1 MG TABS; Take 1 tablet by mouth daily.  Dispense: 30 tablet; Refill: 13  2. Rh negative, antepartum        3. Language barrier     Video  interpreter used  4. Susceptible to varicella (non-immune), currently pregnant      Varicella postpartum  Preterm labor symptoms and general obstetric precautions including but not limited to vaginal bleeding, contractions, leaking of fluid and fetal movement were reviewed in detail with the patient. Please refer to After Visit Summary for other counseling recommendations.  Return in about 3 weeks (around 11/05/2016) for ROB, 2 hr OGTT.   Roe Coombsachelle A Levi Crass, CNM

## 2016-10-15 NOTE — Progress Notes (Signed)
FLU vaccine given. Tolerated well.

## 2016-10-20 ENCOUNTER — Other Ambulatory Visit: Payer: Self-pay | Admitting: Certified Nurse Midwife

## 2016-10-20 DIAGNOSIS — Z3480 Encounter for supervision of other normal pregnancy, unspecified trimester: Secondary | ICD-10-CM

## 2016-10-20 MED ORDER — PROVIDA OB 20-20-1.25 MG PO CAPS: 1.0000 | ORAL_CAPSULE | Freq: Every day | ORAL | 12 refills | Status: DC

## 2016-11-05 ENCOUNTER — Other Ambulatory Visit: Payer: Medicaid Other

## 2016-11-05 ENCOUNTER — Ambulatory Visit (INDEPENDENT_AMBULATORY_CARE_PROVIDER_SITE_OTHER): Payer: Medicaid Other | Admitting: Certified Nurse Midwife

## 2016-11-05 ENCOUNTER — Other Ambulatory Visit (HOSPITAL_COMMUNITY)
Admission: RE | Admit: 2016-11-05 | Discharge: 2016-11-05 | Disposition: A | Discharge status: Home / Self Care | Payer: Medicaid Other | Source: Ambulatory Visit | Attending: Certified Nurse Midwife | Admitting: Certified Nurse Midwife

## 2016-11-05 VITALS — BP 95/59 | HR 80 | Wt 151.0 lb

## 2016-11-05 DIAGNOSIS — Z23 Encounter for immunization: Secondary | ICD-10-CM | POA: Diagnosis not present

## 2016-11-05 DIAGNOSIS — Z3480 Encounter for supervision of other normal pregnancy, unspecified trimester: Secondary | ICD-10-CM

## 2016-11-05 DIAGNOSIS — Z789 Other specified health status: Secondary | ICD-10-CM

## 2016-11-05 DIAGNOSIS — Z283 Underimmunization status: Secondary | ICD-10-CM

## 2016-11-05 DIAGNOSIS — B373 Candidiasis of vulva and vagina: Secondary | ICD-10-CM | POA: Insufficient documentation

## 2016-11-05 DIAGNOSIS — B3731 Acute candidiasis of vulva and vagina: Secondary | ICD-10-CM

## 2016-11-05 DIAGNOSIS — R399 Unspecified symptoms and signs involving the genitourinary system: Secondary | ICD-10-CM

## 2016-11-05 DIAGNOSIS — N90810 Female genital mutilation status, unspecified: Secondary | ICD-10-CM

## 2016-11-05 DIAGNOSIS — Z3483 Encounter for supervision of other normal pregnancy, third trimester: Secondary | ICD-10-CM

## 2016-11-05 DIAGNOSIS — Z6791 Unspecified blood type, Rh negative: Secondary | ICD-10-CM

## 2016-11-05 DIAGNOSIS — O09899 Supervision of other high risk pregnancies, unspecified trimester: Secondary | ICD-10-CM

## 2016-11-05 DIAGNOSIS — O26899 Other specified pregnancy related conditions, unspecified trimester: Secondary | ICD-10-CM

## 2016-11-05 MED ORDER — TERCONAZOLE 0.8 % VA CREA: 1.0000 | TOPICAL_CREAM | Freq: Every day | VAGINAL | 0 refills | Status: DC

## 2016-11-05 MED ORDER — FLUCONAZOLE 150 MG PO TABS: 150.0000 mg | ORAL_TABLET | Freq: Once | ORAL | 0 refills | Status: AC

## 2016-11-05 NOTE — Progress Notes (Signed)
   PRENATAL VISIT NOTE  Subjective:  Madison Elfadl Abdelma Bishop is a 26 y.o. G2P1001 at [redacted]w[redacted]d being seen today for ongoing prenatal care.  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  Patient reports no bleeding, no contractions, no cramping, no leaking, vaginal irritation and UTI symptoms.  Contractions: Not present. Vag. Bleeding: None.  Movement: Present. Denies leaking of fluid.   The following portions of the patient's history were reviewed and updated as appropriate: allergies, current medications, past family history, past medical history, past social history, past surgical history and problem list. Problem list updated.  Objective:   Vitals:   11/05/16 0834  BP: (!) 95/59  Pulse: 80  Weight: 151 lb (68.5 kg)    Fetal Status:     Movement: Present     General:  Alert, oriented and cooperative. Patient is in no acute distress.  Skin: Skin is warm and dry. No rash noted.   Cardiovascular: Normal heart rate noted  Respiratory: Normal respiratory effort, no problems with respiration noted  Abdomen: Soft, gravid, appropriate for gestational age.  Pain/Pressure: Absent     Pelvic: Cervical exam deferred        Extremities: Normal range of motion.  Edema: None  Mental Status:  Normal mood and affect. Normal behavior. Normal judgment and thought content.   Assessment and Plan:  Pregnancy: G2P1001 at [redacted]w[redacted]d  1. Encounter for supervision of other normal pregnancy, unspecified trimester      UTI symptoms: UC ordered.  - Glucose Tolerance, 2 Hours w/1 Hour - CBC - HIV antibody (with reflex) - RPR  2. Susceptible to varicella (non-immune), currently pregnant      Varicella postpartum  3. Rh negative,  antepartum     Needs Rhogam, stock out in office  4. Language barrier     Video interpreter used.   5. Yeast vaginitis    - fluconazole (DIFLUCAN) 150 MG tablet; Take 1 tablet (150 mg total) by mouth once.  Dispense: 1 tablet; Refill: 0 - terconazole (TERAZOL 3) 0.8 % vaginal cream; Place 1 applicator vaginally at bedtime.  Dispense: 20 g; Refill: 0  6. 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   Preterm labor symptoms and general obstetric precautions including but not limited to vaginal bleeding, contractions, leaking of fluid and fetal movement were reviewed in detail with the patient. Please refer to After Visit Summary for other counseling recommendations.  No Follow-up on file.   Roe Coombs, CNM

## 2016-11-05 NOTE — Addendum Note (Signed)
Addended by: Marya Landry D on: 11/05/2016 02:49 PM   Modules accepted: Orders

## 2016-11-05 NOTE — Progress Notes (Signed)
Arabic Interpreter: ROB/GTT. TDAP given in left deltoid. Tolerated well. Patient complains of itching, burning and a rash on vagina.

## 2016-11-06 ENCOUNTER — Other Ambulatory Visit: Payer: Self-pay | Admitting: Certified Nurse Midwife

## 2016-11-06 DIAGNOSIS — O99013 Anemia complicating pregnancy, third trimester: Secondary | ICD-10-CM

## 2016-11-06 DIAGNOSIS — Z3480 Encounter for supervision of other normal pregnancy, unspecified trimester: Secondary | ICD-10-CM

## 2016-11-06 LAB — HIV ANTIBODY (ROUTINE TESTING W REFLEX): HIV Screen 4th Generation wRfx: NONREACTIVE

## 2016-11-06 LAB — CERVICOVAGINAL ANCILLARY ONLY
Bacterial vaginitis: NEGATIVE
CANDIDA VAGINITIS: POSITIVE — AB
CHLAMYDIA, DNA PROBE: NEGATIVE
NEISSERIA GONORRHEA: NEGATIVE
TRICH (WINDOWPATH): NEGATIVE

## 2016-11-06 LAB — GLUCOSE TOLERANCE, 2 HOURS W/ 1HR
GLUCOSE, 2 HOUR: 75 mg/dL (ref 65–152)
Glucose, 1 hour: 117 mg/dL (ref 65–179)
Glucose, Fasting: 75 mg/dL (ref 65–91)

## 2016-11-06 LAB — CBC
Hematocrit: 31.9 % — ABNORMAL LOW (ref 34.0–46.6)
Hemoglobin: 10.4 g/dL — ABNORMAL LOW (ref 11.1–15.9)
MCH: 28.4 pg (ref 26.6–33.0)
MCHC: 32.6 g/dL (ref 31.5–35.7)
MCV: 87 fL (ref 79–97)
PLATELETS: 323 10*3/uL (ref 150–379)
RBC: 3.66 x10E6/uL — ABNORMAL LOW (ref 3.77–5.28)
RDW: 14 % (ref 12.3–15.4)
WBC: 7.2 10*3/uL (ref 3.4–10.8)

## 2016-11-06 LAB — RPR: RPR Ser Ql: NONREACTIVE

## 2016-11-06 MED ORDER — CITRANATAL BLOOM 90-1 MG PO TABS: 1.0000 | ORAL_TABLET | Freq: Every day | ORAL | 12 refills | Status: DC

## 2016-11-10 ENCOUNTER — Other Ambulatory Visit: Payer: Self-pay | Admitting: Certified Nurse Midwife

## 2016-11-10 DIAGNOSIS — B3731 Acute candidiasis of vulva and vagina: Secondary | ICD-10-CM

## 2016-11-10 DIAGNOSIS — B373 Candidiasis of vulva and vagina: Secondary | ICD-10-CM

## 2016-11-10 LAB — CULTURE, OB URINE

## 2016-11-10 LAB — URINE CULTURE, OB REFLEX

## 2016-11-10 MED ORDER — FLUCONAZOLE 150 MG PO TABS: 150.0000 mg | ORAL_TABLET | Freq: Once | ORAL | 0 refills | Status: AC

## 2016-11-10 MED ORDER — TERCONAZOLE 0.8 % VA CREA: 1.0000 | TOPICAL_CREAM | Freq: Every day | VAGINAL | 0 refills | Status: DC

## 2016-11-19 ENCOUNTER — Ambulatory Visit (INDEPENDENT_AMBULATORY_CARE_PROVIDER_SITE_OTHER): Payer: Medicaid Other | Admitting: Certified Nurse Midwife

## 2016-11-19 ENCOUNTER — Encounter: Payer: Self-pay | Admitting: Certified Nurse Midwife

## 2016-11-19 VITALS — BP 92/59 | HR 80 | Wt 149.9 lb

## 2016-11-19 DIAGNOSIS — Z6791 Unspecified blood type, Rh negative: Secondary | ICD-10-CM | POA: Diagnosis not present

## 2016-11-19 DIAGNOSIS — Z3483 Encounter for supervision of other normal pregnancy, third trimester: Secondary | ICD-10-CM

## 2016-11-19 DIAGNOSIS — Z283 Underimmunization status: Secondary | ICD-10-CM

## 2016-11-19 DIAGNOSIS — O09899 Supervision of other high risk pregnancies, unspecified trimester: Secondary | ICD-10-CM

## 2016-11-19 DIAGNOSIS — Z789 Other specified health status: Secondary | ICD-10-CM

## 2016-11-19 DIAGNOSIS — Z3480 Encounter for supervision of other normal pregnancy, unspecified trimester: Secondary | ICD-10-CM

## 2016-11-19 DIAGNOSIS — O26899 Other specified pregnancy related conditions, unspecified trimester: Secondary | ICD-10-CM

## 2016-11-19 MED ORDER — RHO D IMMUNE GLOBULIN 1500 UNIT/2ML IJ SOSY
300.0000 ug | PREFILLED_SYRINGE | Freq: Once | INTRAMUSCULAR | Status: AC
  Administered 2016-11-19: 300 ug via INTRAMUSCULAR

## 2016-11-19 NOTE — Progress Notes (Signed)
   PRENATAL VISIT NOTE  Subjective:  Madison Bishop is a 26 y.o. G2P1001 at 6173w3d being seen today for ongoing prenatal care.  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  Patient reports no complaints.  Contractions: Not present. Vag. Bleeding: None.  Movement: Present. Denies leaking of fluid.   The following portions of the patient's history were reviewed and updated as appropriate: allergies, current medications, past family history, past medical history, past social history, past surgical history and problem list. Problem list updated.  Objective:   Vitals:   11/19/16 0919  BP: (!) 92/59  Pulse: 80  Weight: 149 lb 14.4 oz (68 kg)    Fetal Status: Fetal Heart Rate (bpm): 147; doppler Fundal Height: 29 cm Movement: Present     General:  Alert, oriented and cooperative. Patient is in no acute distress.  Skin: Skin is warm and dry. No rash noted.   Cardiovascular: Normal heart rate noted  Respiratory: Normal respiratory effort, no problems with respiration noted  Abdomen: Soft, gravid, appropriate for gestational age.  Pain/Pressure: Absent     Pelvic: Cervical exam deferred        Extremities: Normal range of motion.  Edema: None  Mental Status:  Normal mood and affect. Normal behavior. Normal judgment and thought content.   Assessment and Plan:  Pregnancy: G2P1001 at 8473w3d  1. Encounter for supervision of other normal pregnancy, unspecified trimester     Doing well.  Video interpreter used.   2. Rh negative, antepartum      - rho (d) immune globulin (RHIG/RHOPHYLAC) injection 300 mcg; Inject 2 mLs (300 mcg total) into the muscle once.  3. Susceptible to varicella (non-immune),  currently pregnant     Varicella posptartum.   Preterm labor symptoms and general obstetric precautions including but not limited to vaginal bleeding, contractions, leaking of fluid and fetal movement were reviewed in detail with the patient. Please refer to After Visit Summary for other counseling recommendations.  Return in about 2 weeks (around 12/03/2016) for ROB.   Roe Coombsachelle A Iyanah Demont, CNM

## 2016-11-19 NOTE — Patient Instructions (Signed)
AREA PEDIATRIC/FAMILY PRACTICE PHYSICIANS  [facility] [address], [address] [place], [place]  [address] Phone - [phone]   Fax - [phone]  [facility] [address] [place], [place] [address] Phone - [phone]   Fax - [phone]  [facility] [address] [place], [place]  [address] Phone - [phone]   Fax - [phone]  [facility] [address] [place], [place]  [address] Phone - [phone]   Fax - [phone]  [facility] [address] [place], [place]  [address] Phone - [phone]   Fax - [phone]  [facility] [address] [place], [place]  [address] Phone - [phone]   Fax - [phone]  [facility] [address] [place], [place]  [address] Phone - [phone]   Fax - [phone]  [facility] [address] [place], [place]  [address] Phone - [phone]   Fax - [phone]  [facility] [address] [place], [place]  [address] Phone - [phone]   Fax - [phone] [facility] [address] [place], [place]  [address] Phone - [phone]   Fax - [phone]  [facility] [address] [place], [place]  [address] Phone - [phone]   Fax - [phone]  [facility] [address] [place], [place]  [address] Phone - [phone]   Fax - [phone]  [facility] [address] [place], [place]  [address] Phone - [phone]   Fax - [phone]  [facility] [address] [place], [place]  [address] Phone - [phone]  [facility] [address] [place], [place]  [address] Phone - [phone]   Fax - [phone]  [facility] [address] [place], [place]  [address] Phone - [phone]   Fax - [phone]  [facility] [address] [place], [place]  [address] Phone - [phone]   Fax - [phone]  [facility] [address] [place], [place]  [address] Phone - [phone]   Fax - [phone]   [facility] [address] [place], [place]  [address] Phone - [phone]   Fax - [phone]  [facility] [address] [place], [place]  [address] Phone - [phone]   Fax - [phone]  [facility] [address] [place], [place]  [address] Phone - [phone]   Fax - [phone]  [facility] [address] [place], [place]  [address] Phone - [phone]   Fax - [phone]  [facility] [address] , [place]  [address] Phone - [phone]   Fax - [phone] [facility] [address] [place], [place]  [address] Phone - [phone]   Fax - [phone]  [facility] [address] [place], [place]  [address] Phone - [phone]   Fax - [phone]  [facility] [address] [place], [place]  [address] Phone - [phone]   Fax - [phone]  [facility] Charlene Flemming MD [address] [place] [address] Phone [phone]  Fax [phone]   

## 2016-11-19 NOTE — Progress Notes (Signed)
Patient reports good fetal movement, denies pain. 

## 2016-12-03 ENCOUNTER — Ambulatory Visit (INDEPENDENT_AMBULATORY_CARE_PROVIDER_SITE_OTHER): Payer: Medicaid Other | Admitting: Certified Nurse Midwife

## 2016-12-03 VITALS — BP 112/61 | HR 88 | Wt 153.6 lb

## 2016-12-03 DIAGNOSIS — Z6791 Unspecified blood type, Rh negative: Secondary | ICD-10-CM

## 2016-12-03 DIAGNOSIS — O09899 Supervision of other high risk pregnancies, unspecified trimester: Secondary | ICD-10-CM

## 2016-12-03 DIAGNOSIS — Z3480 Encounter for supervision of other normal pregnancy, unspecified trimester: Secondary | ICD-10-CM

## 2016-12-03 DIAGNOSIS — Z789 Other specified health status: Secondary | ICD-10-CM

## 2016-12-03 DIAGNOSIS — O26899 Other specified pregnancy related conditions, unspecified trimester: Secondary | ICD-10-CM

## 2016-12-03 DIAGNOSIS — O2613 Low weight gain in pregnancy, third trimester: Secondary | ICD-10-CM

## 2016-12-03 DIAGNOSIS — Z283 Underimmunization status: Secondary | ICD-10-CM

## 2016-12-03 NOTE — Progress Notes (Signed)
   PRENATAL VISIT NOTE  Subjective:  Madison Bishop is a 26 y.o. G2P1001 at 603w3d being seen today for ongoing prenatal care.  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  Patient reports no complaints.  Contractions: Not present. Vag. Bleeding: None.  Movement: Present. Denies leaking of fluid.   The following portions of the patient's history were reviewed and updated as appropriate: allergies, current medications, past family history, past medical history, past social history, past surgical history and problem list. Problem list updated.  Objective:   Vitals:   12/03/16 0812  BP: 112/61  Pulse: 88  Weight: 153 lb 9.6 oz (69.7 kg)    Fetal Status: Fetal Heart Rate (bpm): 135; doppler Fundal Height: 31 cm Movement: Present     General:  Alert, oriented and cooperative. Patient is in no acute distress.  Skin: Skin is warm and dry. No rash noted.   Cardiovascular: Normal heart rate noted  Respiratory: Normal respiratory effort, no problems with respiration noted  Abdomen: Soft, gravid, appropriate for gestational age.  Pain/Pressure: Absent     Pelvic: Cervical exam deferred        Extremities: Normal range of motion.  Edema: None  Mental Status:  Normal mood and affect. Normal behavior. Normal judgment and thought content.   Assessment and Plan:  Pregnancy: G2P1001 at 213w3d  1. Encounter for supervision of other normal pregnancy, unspecified trimester     Reports not drinking much water, increased water intake encouraged.   2. Susceptible to varicella (non-immune), currently pregnant      Varicella postpartum  3. Rh negative, antepartum      Rhogam givne 11/19/16.   4. Language  barrier     Video arabic interpreter used  5. Low maternal weight gain, third trimester      Has lost 1 lb this pregnancy.  - US MFM OB FOLLOW UP; Future  Preterm labor symptoms and general obstetric precautions including but not limited to vaginal bleeding, contractions, leaking of fluid and fetal movement were reviewed in detail with the patient. Please refer to After Visit Summary for other counseling recommendations.  Return in about 2 weeks (around 12/17/2016) for ROB.   Roe Coombsachelle A Danise Dehne, CNM

## 2016-12-03 NOTE — Progress Notes (Signed)
Arabic Interpreter: Marena ChancySoha #[id number]. Pt. Reports no problems today.

## 2016-12-17 ENCOUNTER — Other Ambulatory Visit: Payer: Self-pay

## 2016-12-17 ENCOUNTER — Ambulatory Visit (INDEPENDENT_AMBULATORY_CARE_PROVIDER_SITE_OTHER): Payer: Medicaid Other | Admitting: Certified Nurse Midwife

## 2016-12-17 DIAGNOSIS — Z789 Other specified health status: Secondary | ICD-10-CM

## 2016-12-17 DIAGNOSIS — Z758 Other problems related to medical facilities and other health care: Secondary | ICD-10-CM

## 2016-12-17 DIAGNOSIS — O26899 Other specified pregnancy related conditions, unspecified trimester: Secondary | ICD-10-CM

## 2016-12-17 DIAGNOSIS — Z3483 Encounter for supervision of other normal pregnancy, third trimester: Secondary | ICD-10-CM

## 2016-12-17 DIAGNOSIS — O26843 Uterine size-date discrepancy, third trimester: Secondary | ICD-10-CM

## 2016-12-17 DIAGNOSIS — Z283 Underimmunization status: Secondary | ICD-10-CM

## 2016-12-17 DIAGNOSIS — Z3480 Encounter for supervision of other normal pregnancy, unspecified trimester: Secondary | ICD-10-CM

## 2016-12-17 DIAGNOSIS — Z2839 Other underimmunization status: Secondary | ICD-10-CM

## 2016-12-17 DIAGNOSIS — O09899 Supervision of other high risk pregnancies, unspecified trimester: Secondary | ICD-10-CM

## 2016-12-17 DIAGNOSIS — Z6791 Unspecified blood type, Rh negative: Secondary | ICD-10-CM

## 2016-12-17 DIAGNOSIS — O09893 Supervision of other high risk pregnancies, third trimester: Secondary | ICD-10-CM

## 2016-12-17 NOTE — Progress Notes (Signed)
Arabic Interpreter: Asmaa [phone]#[id number]

## 2016-12-17 NOTE — Progress Notes (Signed)
   PRENATAL VISIT NOTE  Subjective:  Madison Bishop is a 26 y.o. G2P1001 at 7520w3d being seen today for ongoing prenatal care.  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  Patient reports no complaints.  Contractions: Not present. Vag. Bleeding: None.  Movement: Present. Denies leaking of fluid.   The following portions of the patient's history were reviewed and updated as appropriate: allergies, current medications, past family history, past medical history, past social history, past surgical history and problem list. Problem list updated.  Objective:  There were no vitals filed for this visit.  Fetal Status: Fetal Heart Rate (bpm): 140; doppler Fundal Height: 30 cm Movement: Present     General:  Alert, oriented and cooperative. Patient is in no acute distress.  Skin: Skin is warm and dry. No rash noted.   Cardiovascular: Normal heart rate noted  Respiratory: Normal respiratory effort, no problems with respiration noted  Abdomen: Soft, gravid, appropriate for gestational age.  Pain/Pressure: Absent     Pelvic: Cervical exam deferred        Extremities: Normal range of motion.  Edema: None  Mental Status:  Normal mood and affect. Normal behavior. Normal judgment and thought content.   Assessment and Plan:  Pregnancy: G2P1001 at 5820w3d  1. Encounter for supervision of other normal pregnancy, unspecified trimester     Doing well overall.  Video interpreter used.   2. Susceptible to varicella (non-immune), currently pregnant     Varicella postpartum  3. Rh negative, antepartum     Rhogam given previously 11/19/16.   4. Language barrier     Interpreter used.  5. Uterine size date  discrepancy pregnancy, third trimester     S<D - US MFM OB FOLLOW UP; Future  Preterm labor symptoms and general obstetric precautions including but not limited to vaginal bleeding, contractions, leaking of fluid and fetal movement were reviewed in detail with the patient. Please refer to After Visit Summary for other counseling recommendations.  Return in about 1 week (around 12/24/2016) for ROB, GBS.   Roe Coombsachelle A Chevette Fee, CNM

## 2016-12-22 ENCOUNTER — Other Ambulatory Visit: Payer: Self-pay | Admitting: Certified Nurse Midwife

## 2016-12-22 ENCOUNTER — Ambulatory Visit (HOSPITAL_COMMUNITY)
Admission: RE | Admit: 2016-12-22 | Discharge: 2016-12-22 | Disposition: A | Discharge status: Home / Self Care | Payer: Medicaid Other | Source: Ambulatory Visit | Attending: Certified Nurse Midwife | Admitting: Certified Nurse Midwife

## 2016-12-22 DIAGNOSIS — Z3A35 35 weeks gestation of pregnancy: Secondary | ICD-10-CM | POA: Diagnosis not present

## 2016-12-22 DIAGNOSIS — O26843 Uterine size-date discrepancy, third trimester: Secondary | ICD-10-CM | POA: Diagnosis not present

## 2016-12-23 ENCOUNTER — Other Ambulatory Visit: Payer: Self-pay | Admitting: Certified Nurse Midwife

## 2016-12-23 DIAGNOSIS — Z3480 Encounter for supervision of other normal pregnancy, unspecified trimester: Secondary | ICD-10-CM

## 2016-12-24 ENCOUNTER — Encounter: Payer: Self-pay | Admitting: Nurse Practitioner

## 2016-12-24 ENCOUNTER — Ambulatory Visit (INDEPENDENT_AMBULATORY_CARE_PROVIDER_SITE_OTHER): Payer: Medicaid Other | Admitting: Nurse Practitioner

## 2016-12-24 ENCOUNTER — Other Ambulatory Visit (HOSPITAL_COMMUNITY)
Admission: RE | Admit: 2016-12-24 | Discharge: 2016-12-24 | Disposition: A | Discharge status: Home / Self Care | Payer: Medicaid Other | Source: Ambulatory Visit | Attending: Nurse Practitioner | Admitting: Nurse Practitioner

## 2016-12-24 VITALS — BP 109/71 | HR 85 | Wt 153.9 lb

## 2016-12-24 DIAGNOSIS — Z3480 Encounter for supervision of other normal pregnancy, unspecified trimester: Secondary | ICD-10-CM

## 2016-12-24 DIAGNOSIS — Z3483 Encounter for supervision of other normal pregnancy, third trimester: Secondary | ICD-10-CM

## 2016-12-24 LAB — OB RESULTS CONSOLE GC/CHLAMYDIA: Gonorrhea: NEGATIVE

## 2016-12-24 NOTE — Progress Notes (Signed)
    Subjective:  Madison Bishop is a 26 y.o. G2P1001 at 7031w3d being seen today for ongoing prenatal care.  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  Patient reports fast heart rate with no dizziness or syncope.  Resolves with rest..  Contractions: Not present. Vag. Bleeding: None.  Movement: Present. Denies leaking of fluid.   The following portions of the patient's history were reviewed and updated as appropriate: allergies, current medications, past family history, past medical history, past social history, past surgical history and problem list. Problem list updated.  Objective:   Vitals:   12/24/16 0811  BP: 109/71  Pulse: 85  Weight: 153 lb 14.4 oz (69.8 kg)    Fetal Status: Fetal Heart Rate (bpm): 136, doppler Fundal Height: 36 cm Movement: Present     General:  Alert, oriented and cooperative. Patient is in no acute distress.  Skin: Skin is warm and dry. No rash noted.   Cardiovascular: Normal heart rate noted  Respiratory: Normal respiratory effort, no problems with respiration noted  Abdomen: Soft, gravid, appropriate for gestational age. Pain/Pressure: Absent     Pelvic:  Cervical exam deferred      GBS and GX/Chlam done today - Female Circumcision noted  Extremities: Normal range of motion.  Edema: None  Mental Status: Normal mood and affect. Normal behavior. Normal judgment and thought content.   Urinalysis:      Assessment and Plan:  Pregnancy: G2P1001 at 8131w3d  Patient reports good fetal movement and denies pain.  1. Encounter for supervision of other normal pregnancy, unspecified trimester - Cervicovaginal ancillary only - Strep Gp B NAA  Preterm labor symptoms and  general obstetric precautions including but not limited to vaginal bleeding, contractions, leaking of fluid and fetal movement were reviewed in detail with the patient. Please refer to After Visit Summary for other counseling recommendations.  Return in about 1 week (around 12/31/2016).  Nolene BernheimERRI Lathan Gieselman, RN, MSN, NP-BC Nurse Practitioner, [facility], [facility] 12/24/2016 4:31 PM

## 2016-12-24 NOTE — Patient Instructions (Signed)
Return in one week.  

## 2016-12-26 LAB — CERVICOVAGINAL ANCILLARY ONLY
Bacterial vaginitis: NEGATIVE
CHLAMYDIA, DNA PROBE: NEGATIVE
Candida vaginitis: POSITIVE — AB
Neisseria Gonorrhea: NEGATIVE
Trichomonas: NEGATIVE

## 2016-12-26 LAB — STREP GP B NAA: STREP GROUP B AG: NEGATIVE

## 2016-12-31 ENCOUNTER — Ambulatory Visit (INDEPENDENT_AMBULATORY_CARE_PROVIDER_SITE_OTHER): Payer: Medicaid Other | Admitting: Certified Nurse Midwife

## 2016-12-31 ENCOUNTER — Encounter: Payer: Self-pay | Admitting: Certified Nurse Midwife

## 2016-12-31 VITALS — BP 109/71 | HR 93 | Wt 155.6 lb

## 2016-12-31 DIAGNOSIS — Z283 Underimmunization status: Secondary | ICD-10-CM

## 2016-12-31 DIAGNOSIS — Z3480 Encounter for supervision of other normal pregnancy, unspecified trimester: Secondary | ICD-10-CM

## 2016-12-31 DIAGNOSIS — Z3483 Encounter for supervision of other normal pregnancy, third trimester: Secondary | ICD-10-CM

## 2016-12-31 DIAGNOSIS — B373 Candidiasis of vulva and vagina: Secondary | ICD-10-CM

## 2016-12-31 DIAGNOSIS — O09893 Supervision of other high risk pregnancies, third trimester: Secondary | ICD-10-CM

## 2016-12-31 DIAGNOSIS — Z2839 Other underimmunization status: Secondary | ICD-10-CM

## 2016-12-31 DIAGNOSIS — B3731 Acute candidiasis of vulva and vagina: Secondary | ICD-10-CM

## 2016-12-31 DIAGNOSIS — Z789 Other specified health status: Secondary | ICD-10-CM

## 2016-12-31 DIAGNOSIS — O09899 Supervision of other high risk pregnancies, unspecified trimester: Secondary | ICD-10-CM

## 2016-12-31 MED ORDER — FLUCONAZOLE 150 MG PO TABS: 150.0000 mg | ORAL_TABLET | Freq: Once | ORAL | 0 refills | Status: AC

## 2016-12-31 NOTE — Progress Notes (Signed)
   PRENATAL VISIT NOTE  Subjective:  Madison Bishop is a 26 y.o. G2P1001 at 6217w3d being seen today for ongoing prenatal care.  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  Patient reports no bleeding, no contractions, no cramping, no leaking and URI symptoms started 3 days ago. .  Contractions: Not present. Vag. Bleeding: None.  Movement: Present. Denies leaking of fluid.   The following portions of the patient's history were reviewed and updated as appropriate: allergies, current medications, past family history, past medical history, past social history, past surgical history and problem list. Problem list updated.  Objective:   Vitals:   12/31/16 0821  BP: 109/71  Pulse: 93  Weight: 155 lb 9.6 oz (70.6 kg)    Fetal Status: Fetal Heart Rate (bpm): 158; doppler Fundal Height: 34 cm Movement: Present     General:  Alert, oriented and cooperative. Patient is in no acute distress.  Skin: Skin is warm and dry. No rash noted.   Cardiovascular: Normal heart rate noted  Respiratory: Normal respiratory effort, no problems with respiration noted  Abdomen: Soft, gravid, appropriate for gestational age.  Pain/Pressure: Absent     Pelvic: Cervical exam deferred        Extremities: Normal range of motion.  Edema: None  Mental Status:  Normal mood and affect. Normal behavior. Normal judgment and thought content.   Assessment and Plan:  Pregnancy: G2P1001 at 7117w3d  1. Encounter for supervision of other normal pregnancy, unspecified trimester     Doing well.  Acute URI X3 days; no treatment at this time OTC meds recommended, rest and fluids  2. Susceptible to varicella (non-immune), currently pregnant    Varicella postpartum  3. Language barrier     Video interpreter used  4. Yeast vaginitis     - fluconazole (DIFLUCAN) 150 MG tablet; Take 1 tablet (150 mg total) by mouth once for 1 dose.  Dispense: 1 tablet; Refill: 0  Preterm labor symptoms and general obstetric precautions including but not limited to vaginal bleeding, contractions, leaking of fluid and fetal movement were reviewed in detail with the patient. Please refer to After Visit Summary for other counseling recommendations.  Return in about 1 week (around 01/07/2017) for ROB.   Roe Coombsachelle A Jamilex Bohnsack, CNM

## 2016-12-31 NOTE — Progress Notes (Signed)
Pt denies concerns at this time. 

## 2016-12-31 NOTE — Patient Instructions (Signed)
AREA PEDIATRIC/FAMILY PRACTICE PHYSICIANS  [facility] [address], [address] [place], [place]  [address] Phone - [phone]   Fax - [phone]  [facility] [address] [place], [place] [address] Phone - [phone]   Fax - [phone]  [facility] [address] [place], [place]  [address] Phone - [phone]   Fax - [phone]  [facility] [address] [place], [place]  [address] Phone - [phone]   Fax - [phone]  [facility] [address] [place], [place]  [address] Phone - [phone]   Fax - [phone]  [facility] [address] [place], [place]  [address] Phone - [phone]   Fax - [phone]  [facility] [address] [place], [place]  [address] Phone - [phone]   Fax - [phone]  [facility] [address] [place], [place]  [address] Phone - [phone]   Fax - [phone]  [facility] [address] [place], [place]  [address] Phone - [phone]   Fax - [phone] [facility] [address] [place], [place]  [address] Phone - [phone]   Fax - [phone]  [facility] [address] [place], [place]  [address] Phone - [phone]   Fax - [phone]  [facility] [address] [place], [place]  [address] Phone - [phone]   Fax - [phone]  [facility] [address], [address] [place], [place]  [address] Phone - [phone]   Fax - [phone]  [facility] [address] [place], [place]  [address] Phone - [phone]  [facility] [address] [place], [place]  [address] Phone - [phone]   Fax - [phone]  [facility] [address] [place], [place]  [address] Phone - [phone]   Fax - [phone]  [facility] [address] [place], [place]  [address] Phone - [phone]   Fax - [phone]  [facility] [address] [place], [place]  [address] Phone - [phone]   Fax - [phone]   [facility] [address] [place], [place]  [address] Phone - [phone]   Fax - [phone]  [facility] [address] [place], [place]  [address] Phone - [phone]   Fax - [phone]  [facility] [address] [place], [place]  [address] Phone - [phone]   Fax - [phone]  [facility] [address] [place], [place]  [address] Phone - [phone]   Fax - [phone]  [facility] [address] [place], [place]  [address] Phone - [phone]   Fax - [phone] [facility] [address] [place], [place]  [address] Phone - [phone]   Fax - [phone]  [facility] [address] [place], [place]  [address] Phone - [phone]   Fax - [phone]  [facility] [address] [place],   [address] Phone - [phone]   Fax - [phone]  [facility] Charlene Flemming MD [address] [place]  [address] Phone [phone]  Fax [phone]   

## 2017-01-08 ENCOUNTER — Ambulatory Visit (INDEPENDENT_AMBULATORY_CARE_PROVIDER_SITE_OTHER): Payer: Medicaid Other | Admitting: Certified Nurse Midwife

## 2017-01-08 VITALS — BP 103/67 | HR 66 | Wt 154.0 lb

## 2017-01-08 DIAGNOSIS — Z6791 Unspecified blood type, Rh negative: Secondary | ICD-10-CM

## 2017-01-08 DIAGNOSIS — Z789 Other specified health status: Secondary | ICD-10-CM

## 2017-01-08 DIAGNOSIS — O09893 Supervision of other high risk pregnancies, third trimester: Secondary | ICD-10-CM

## 2017-01-08 DIAGNOSIS — Z3480 Encounter for supervision of other normal pregnancy, unspecified trimester: Secondary | ICD-10-CM

## 2017-01-08 DIAGNOSIS — Z283 Underimmunization status: Secondary | ICD-10-CM

## 2017-01-08 DIAGNOSIS — O2613 Low weight gain in pregnancy, third trimester: Secondary | ICD-10-CM

## 2017-01-08 DIAGNOSIS — O2610 Low weight gain in pregnancy, unspecified trimester: Secondary | ICD-10-CM | POA: Insufficient documentation

## 2017-01-08 DIAGNOSIS — O26899 Other specified pregnancy related conditions, unspecified trimester: Secondary | ICD-10-CM

## 2017-01-08 DIAGNOSIS — Z3483 Encounter for supervision of other normal pregnancy, third trimester: Secondary | ICD-10-CM

## 2017-01-08 DIAGNOSIS — O09899 Supervision of other high risk pregnancies, unspecified trimester: Secondary | ICD-10-CM

## 2017-01-08 HISTORY — DX: Low weight gain in pregnancy, unspecified trimester: O26.10

## 2017-01-08 NOTE — Progress Notes (Signed)
   PRENATAL VISIT NOTE  Subjective:  Aneka Elfadl Abdelma Ruffin FrederickKhalid is a 26 y.o. G2P1001 at 1158w4d being seen today for ongoing prenatal care.  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  Patient reports no complaints.  Contractions: Not present. Vag. Bleeding: None.  Movement: Present. Denies leaking of fluid.   The following portions of the patient's history were reviewed and updated as appropriate: allergies, current medications, past family history, past medical history, past social history, past surgical history and problem list. Problem list updated.  Objective:   Vitals:   01/08/17 0901  BP: 103/67  Pulse: 66  Weight: 154 lb (69.9 kg)    Fetal Status: Fetal Heart Rate (bpm): 125; doppler Fundal Height: 35 cm Movement: Present     General:  Alert, oriented and cooperative. Patient is in no acute distress.  Skin: Skin is warm and dry. No rash noted.   Cardiovascular: Normal heart rate noted  Respiratory: Normal respiratory effort, no problems with respiration noted  Abdomen: Soft, gravid, appropriate for gestational age.  Pain/Pressure: Absent     Pelvic: Cervical exam deferred        Extremities: Normal range of motion.  Edema: None  Mental Status:  Normal mood and affect. Normal behavior. Normal judgment and thought content.   Assessment and Plan:  Pregnancy: G2P1001 at 6958w4d  1. Encounter for supervision of other normal pregnancy, unspecified trimester     Doing well.  EFW @35  wks: 28%  2. Susceptible to varicella (non-immune), currently pregnant     Varicella at HD PP  3. Rh negative, antepartum     Rhogam given previously  4. Language barrier     Video  interpreter used  5. Low weight gain during pregnancy in third trimester     Has lost 1lb this pregnancy.  Reports normal fetal movement.  Fetal kick counts reviewed.   Term labor symptoms and general obstetric precautions including but not limited to vaginal bleeding, contractions, leaking of fluid and fetal movement were reviewed in detail with the patient. Please refer to After Visit Summary for other counseling recommendations.  Return in about 1 week (around 01/15/2017) for ROB.   Roe Coombsachelle A Narada Uzzle, CNM

## 2017-01-14 ENCOUNTER — Other Ambulatory Visit: Payer: Self-pay

## 2017-01-14 ENCOUNTER — Ambulatory Visit (INDEPENDENT_AMBULATORY_CARE_PROVIDER_SITE_OTHER): Payer: Medicaid Other | Admitting: Certified Nurse Midwife

## 2017-01-14 ENCOUNTER — Encounter: Payer: Self-pay | Admitting: Certified Nurse Midwife

## 2017-01-14 VITALS — BP 118/62 | HR 87 | Wt 156.4 lb

## 2017-01-14 DIAGNOSIS — Z789 Other specified health status: Secondary | ICD-10-CM

## 2017-01-14 DIAGNOSIS — Z3480 Encounter for supervision of other normal pregnancy, unspecified trimester: Secondary | ICD-10-CM

## 2017-01-14 DIAGNOSIS — Z758 Other problems related to medical facilities and other health care: Secondary | ICD-10-CM

## 2017-01-14 DIAGNOSIS — O09899 Supervision of other high risk pregnancies, unspecified trimester: Secondary | ICD-10-CM

## 2017-01-14 DIAGNOSIS — Z283 Underimmunization status: Secondary | ICD-10-CM

## 2017-01-14 DIAGNOSIS — O09893 Supervision of other high risk pregnancies, third trimester: Secondary | ICD-10-CM

## 2017-01-14 DIAGNOSIS — Z2839 Other underimmunization status: Secondary | ICD-10-CM

## 2017-01-14 NOTE — Progress Notes (Signed)
   PRENATAL VISIT NOTE  Subjective:  Madison Bishop is a 26 y.o. G2P1001 at 345w3d being seen today for ongoing prenatal care.  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  Patient reports no complaints.  Contractions: Not present. Vag. Bleeding: None.  Movement: Present. Denies leaking of fluid.   The following portions of the patient's history were reviewed and updated as appropriate: allergies, current medications, past family history, past medical history, past social history, past surgical history and problem list. Problem list updated.  Objective:   Vitals:   01/14/17 1538  BP: 118/62  Pulse: 87  Weight: 156 lb 6.4 oz (70.9 kg)    Fetal Status: Fetal Heart Rate (bpm): 142; doppler Fundal Height: 35 cm Movement: Present     General:  Alert, oriented and cooperative. Patient is in no acute distress.  Skin: Skin is warm and dry. No rash noted.   Cardiovascular: Normal heart rate noted  Respiratory: Normal respiratory effort, no problems with respiration noted  Abdomen: Soft, gravid, appropriate for gestational age.  Pain/Pressure: Absent     Pelvic: Cervical exam deferred        Extremities: Normal range of motion.  Edema: None  Mental Status:  Normal mood and affect. Normal behavior. Normal judgment and thought content.   Assessment and Plan:  Pregnancy: G2P1001 at 155w3d  1. Encounter for supervision of other normal pregnancy, unspecified trimester     Doing well  2. Susceptible to varicella (non-immune), currently pregnant     Varicella postpartum  3. Language barrier     Arabic video interpreter used  Preterm labor symptoms and general obstetric  precautions including but not limited to vaginal bleeding, contractions, leaking of fluid and fetal movement were reviewed in detail with the patient. Please refer to After Visit Summary for other counseling recommendations.  Return in about 1 week (around 01/21/2017) for ROB, schedule IOL for 41 weeks.   Roe Coombsachelle A Winta Barcelo, CNM

## 2017-01-21 ENCOUNTER — Inpatient Hospital Stay (HOSPITAL_COMMUNITY): Payer: Medicaid Other | Admitting: Anesthesiology

## 2017-01-21 ENCOUNTER — Inpatient Hospital Stay (HOSPITAL_COMMUNITY)
Admission: AD | Admit: 2017-01-21 | Discharge: 2017-01-23 | DRG: 807 | Disposition: A | Discharge status: Home / Self Care | Payer: Medicaid Other | Source: Ambulatory Visit | Attending: Family Medicine | Admitting: Family Medicine

## 2017-01-21 ENCOUNTER — Encounter: Payer: Medicaid Other | Admitting: Certified Nurse Midwife

## 2017-01-21 ENCOUNTER — Encounter (HOSPITAL_COMMUNITY): Payer: Self-pay

## 2017-01-21 DIAGNOSIS — O26893 Other specified pregnancy related conditions, third trimester: Secondary | ICD-10-CM | POA: Diagnosis present

## 2017-01-21 DIAGNOSIS — O26899 Other specified pregnancy related conditions, unspecified trimester: Secondary | ICD-10-CM

## 2017-01-21 DIAGNOSIS — Z283 Underimmunization status: Secondary | ICD-10-CM

## 2017-01-21 DIAGNOSIS — N90810 Female genital mutilation status, unspecified: Secondary | ICD-10-CM | POA: Diagnosis present

## 2017-01-21 DIAGNOSIS — Z3A39 39 weeks gestation of pregnancy: Secondary | ICD-10-CM | POA: Diagnosis not present

## 2017-01-21 DIAGNOSIS — Z2839 Other underimmunization status: Secondary | ICD-10-CM

## 2017-01-21 DIAGNOSIS — Z6791 Unspecified blood type, Rh negative: Secondary | ICD-10-CM | POA: Diagnosis not present

## 2017-01-21 DIAGNOSIS — Z3480 Encounter for supervision of other normal pregnancy, unspecified trimester: Secondary | ICD-10-CM

## 2017-01-21 DIAGNOSIS — O3483 Maternal care for other abnormalities of pelvic organs, third trimester: Secondary | ICD-10-CM | POA: Diagnosis present

## 2017-01-21 DIAGNOSIS — Z3483 Encounter for supervision of other normal pregnancy, third trimester: Secondary | ICD-10-CM | POA: Diagnosis present

## 2017-01-21 DIAGNOSIS — O09899 Supervision of other high risk pregnancies, unspecified trimester: Secondary | ICD-10-CM

## 2017-01-21 LAB — CBC
HCT: 31 % — ABNORMAL LOW (ref 36.0–46.0)
Hemoglobin: 10.3 g/dL — ABNORMAL LOW (ref 12.0–15.0)
MCH: 27.2 pg (ref 26.0–34.0)
MCHC: 33.2 g/dL (ref 30.0–36.0)
MCV: 81.8 fL (ref 78.0–100.0)
Platelets: 376 10*3/uL (ref 150–400)
RBC: 3.79 MIL/uL — ABNORMAL LOW (ref 3.87–5.11)
RDW: 12.9 % (ref 11.5–15.5)
WBC: 7.9 10*3/uL (ref 4.0–10.5)

## 2017-01-21 LAB — OB RESULTS CONSOLE GBS: GBS: NEGATIVE

## 2017-01-21 MED ORDER — LACTATED RINGERS IV SOLN: 500.0000 mL | INTRAVENOUS | Status: DC | PRN

## 2017-01-21 MED ORDER — ACETAMINOPHEN 325 MG PO TABS: 650.0000 mg | ORAL_TABLET | ORAL | Status: DC | PRN

## 2017-01-21 MED ORDER — TETANUS-DIPHTH-ACELL PERTUSSIS 5-2.5-18.5 LF-MCG/0.5 IM SUSP: 0.5000 mL | Freq: Once | INTRAMUSCULAR | Status: DC

## 2017-01-21 MED ORDER — LACTATED RINGERS IV SOLN: 500.0000 mL | Freq: Once | INTRAVENOUS | Status: DC

## 2017-01-21 MED ORDER — OXYTOCIN 40 UNITS IN LACTATED RINGERS INFUSION - SIMPLE MED
1.0000 m[IU]/min | INTRAVENOUS | Status: DC
  Administered 2017-01-21: 2 m[IU]/min via INTRAVENOUS

## 2017-01-21 MED ORDER — ONDANSETRON HCL 4 MG/2ML IJ SOLN: 4.0000 mg | Freq: Four times a day (QID) | INTRAMUSCULAR | Status: DC | PRN

## 2017-01-21 MED ORDER — SIMETHICONE 80 MG PO CHEW: 80.0000 mg | CHEWABLE_TABLET | ORAL | Status: DC | PRN

## 2017-01-21 MED ORDER — SOD CITRATE-CITRIC ACID 500-334 MG/5ML PO SOLN: 30.0000 mL | ORAL | Status: DC | PRN

## 2017-01-21 MED ORDER — ONDANSETRON HCL 4 MG PO TABS: 4.0000 mg | ORAL_TABLET | ORAL | Status: DC | PRN

## 2017-01-21 MED ORDER — ONDANSETRON HCL 4 MG/2ML IJ SOLN: 4.0000 mg | INTRAMUSCULAR | Status: DC | PRN

## 2017-01-21 MED ORDER — OXYTOCIN BOLUS FROM INFUSION: 500.0000 mL | Freq: Once | INTRAVENOUS | Status: DC

## 2017-01-21 MED ORDER — DIBUCAINE 1 % RE OINT: 1.0000 "application " | TOPICAL_OINTMENT | RECTAL | Status: DC | PRN

## 2017-01-21 MED ORDER — TERBUTALINE SULFATE 1 MG/ML IJ SOLN
0.2500 mg | Freq: Once | INTRAMUSCULAR | Status: DC | PRN
  Filled 2017-01-21: qty 1

## 2017-01-21 MED ORDER — LACTATED RINGERS IV SOLN
INTRAVENOUS | Status: DC
  Administered 2017-01-21: 11:00:00 via INTRAVENOUS

## 2017-01-21 MED ORDER — IBUPROFEN 600 MG PO TABS
600.0000 mg | ORAL_TABLET | Freq: Four times a day (QID) | ORAL | Status: DC
  Administered 2017-01-21 – 2017-01-23 (×6): 600 mg via ORAL
  Filled 2017-01-21 (×6): qty 1

## 2017-01-21 MED ORDER — SENNOSIDES-DOCUSATE SODIUM 8.6-50 MG PO TABS
2.0000 | ORAL_TABLET | ORAL | Status: DC
  Administered 2017-01-21 – 2017-01-22 (×2): 2 via ORAL
  Filled 2017-01-21 (×2): qty 2

## 2017-01-21 MED ORDER — DIPHENHYDRAMINE HCL 25 MG PO CAPS: 25.0000 mg | ORAL_CAPSULE | Freq: Four times a day (QID) | ORAL | Status: DC | PRN

## 2017-01-21 MED ORDER — OXYCODONE-ACETAMINOPHEN 5-325 MG PO TABS: 2.0000 | ORAL_TABLET | ORAL | Status: DC | PRN

## 2017-01-21 MED ORDER — PHENYLEPHRINE 40 MCG/ML (10ML) SYRINGE FOR IV PUSH (FOR BLOOD PRESSURE SUPPORT)
80.0000 ug | PREFILLED_SYRINGE | INTRAVENOUS | Status: DC | PRN
  Filled 2017-01-21: qty 5

## 2017-01-21 MED ORDER — WITCH HAZEL-GLYCERIN EX PADS: 1.0000 "application " | MEDICATED_PAD | CUTANEOUS | Status: DC | PRN

## 2017-01-21 MED ORDER — OXYTOCIN 40 UNITS IN LACTATED RINGERS INFUSION - SIMPLE MED
2.5000 [IU]/h | INTRAVENOUS | Status: DC
  Filled 2017-01-21: qty 1000

## 2017-01-21 MED ORDER — ZOLPIDEM TARTRATE 5 MG PO TABS: 5.0000 mg | ORAL_TABLET | Freq: Every evening | ORAL | Status: DC | PRN

## 2017-01-21 MED ORDER — DIPHENHYDRAMINE HCL 50 MG/ML IJ SOLN: 12.5000 mg | INTRAMUSCULAR | Status: DC | PRN

## 2017-01-21 MED ORDER — COCONUT OIL OIL: 1.0000 "application " | TOPICAL_OIL | Status: DC | PRN

## 2017-01-21 MED ORDER — FENTANYL CITRATE (PF) 100 MCG/2ML IJ SOLN: 50.0000 ug | INTRAMUSCULAR | Status: DC | PRN

## 2017-01-21 MED ORDER — FENTANYL 2.5 MCG/ML BUPIVACAINE 1/10 % EPIDURAL INFUSION (WH - ANES)
14.0000 mL/h | INTRAMUSCULAR | Status: DC | PRN
  Administered 2017-01-21: 14 mL/h via EPIDURAL
  Filled 2017-01-21: qty 100

## 2017-01-21 MED ORDER — BENZOCAINE-MENTHOL 20-0.5 % EX AERO
1.0000 "application " | INHALATION_SPRAY | CUTANEOUS | Status: DC | PRN
  Administered 2017-01-22: 1 via TOPICAL
  Filled 2017-01-21: qty 56

## 2017-01-21 MED ORDER — LIDOCAINE HCL (PF) 1 % IJ SOLN
30.0000 mL | INTRAMUSCULAR | Status: DC | PRN
  Filled 2017-01-21: qty 30

## 2017-01-21 MED ORDER — LIDOCAINE HCL (PF) 1 % IJ SOLN
INTRAMUSCULAR | Status: DC | PRN
  Administered 2017-01-21 (×2): 5 mL

## 2017-01-21 MED ORDER — MISOPROSTOL 200 MCG PO TABS
ORAL_TABLET | ORAL | Status: AC
  Administered 2017-01-21: 1000 ug
  Filled 2017-01-21: qty 5

## 2017-01-21 MED ORDER — EPHEDRINE 5 MG/ML INJ
10.0000 mg | INTRAVENOUS | Status: DC | PRN
  Filled 2017-01-21: qty 2

## 2017-01-21 MED ORDER — NALBUPHINE HCL 10 MG/ML IJ SOLN
10.0000 mg | Freq: Once | INTRAMUSCULAR | Status: AC
  Administered 2017-01-21: 10 mg via INTRAMUSCULAR
  Filled 2017-01-21: qty 1

## 2017-01-21 MED ORDER — ACETAMINOPHEN 325 MG PO TABS
650.0000 mg | ORAL_TABLET | ORAL | Status: DC | PRN
  Administered 2017-01-22 – 2017-01-23 (×5): 650 mg via ORAL
  Filled 2017-01-21 (×5): qty 2

## 2017-01-21 MED ORDER — PHENYLEPHRINE 40 MCG/ML (10ML) SYRINGE FOR IV PUSH (FOR BLOOD PRESSURE SUPPORT)
80.0000 ug | PREFILLED_SYRINGE | INTRAVENOUS | Status: DC | PRN
  Filled 2017-01-21: qty 5
  Filled 2017-01-21: qty 10

## 2017-01-21 MED ORDER — PRENATAL MULTIVITAMIN CH
1.0000 | ORAL_TABLET | Freq: Every day | ORAL | Status: DC
  Administered 2017-01-22: 1 via ORAL
  Filled 2017-01-21: qty 1

## 2017-01-21 MED ORDER — OXYCODONE-ACETAMINOPHEN 5-325 MG PO TABS
1.0000 | ORAL_TABLET | ORAL | Status: DC | PRN
Start: 2017-01-21 — End: 2017-01-21

## 2017-01-21 NOTE — Anesthesia Pain Management Evaluation Note (Signed)
  CRNA Pain Management Visit Note  Patient: Madison Bishop, 26 y.o., female  "Hello [facility]. We have an anesthesia team available at all times to provide care throughout the hospital, including epidural management and anesthesia for C-section. I don't know your plan for the delivery whether it a natural birth, water birth, IV sedation, nitrous supplementation, doula or epidural, but we want to meet your pain goals."   1.Was your pain managed to your expectations on prior hospitalizations?   Yes   2.What is your expectation for pain management during this hospitalization?     Epidural  3.How can we help you reach that goal?   Record the patient's initial score and the patient's pain goal.   Pain: 8  Pain Goal: 3 [facility] wants you to be able to say your pain was always managed very well.  Madison Bishop,Madison Bishop 01/21/2017

## 2017-01-21 NOTE — Anesthesia Preprocedure Evaluation (Signed)
Anesthesia Evaluation  Patient identified by MRN, date of birth, ID band Patient awake    Reviewed: Allergy & Precautions, H&P , NPO status , Patient's Chart, lab work & pertinent test results  History of Anesthesia Complications Negative for: history of anesthetic complications  Airway Mallampati: II  TM Distance: >3 FB Neck ROM: full    Dental no notable dental hx. (+) Teeth Intact   Pulmonary neg pulmonary ROS,    Pulmonary exam normal breath sounds clear to auscultation       Cardiovascular negative cardio ROS Normal cardiovascular exam Rhythm:regular Rate:Normal     Neuro/Psych negative neurological ROS  negative psych ROS   GI/Hepatic negative GI ROS, Neg liver ROS,   Endo/Other  negative endocrine ROS  Renal/GU negative Renal ROS  negative genitourinary   Musculoskeletal   Abdominal   Peds  Hematology negative hematology ROS (+)   Anesthesia Other Findings Non english speaking  Reproductive/Obstetrics (+) Pregnancy                             Anesthesia Physical  Anesthesia Plan  ASA: II  Anesthesia Plan: Epidural   Post-op Pain Management:    Induction:   PONV Risk Score and Plan:   Airway Management Planned:   Additional Equipment:   Intra-op Plan:   Post-operative Plan:   Informed Consent: I have reviewed the patients History and Physical, chart, labs and discussed the procedure including the risks, benefits and alternatives for the proposed anesthesia with the patient or authorized representative who has indicated his/her understanding and acceptance.       Plan Discussed with:   Anesthesia Plan Comments:         Anesthesia Quick Evaluation  

## 2017-01-21 NOTE — MAU Note (Addendum)
Patient reports  +contractions Every 2-5 minutes since 4am Rating pain 6/10 --requesting epidural  +bloody show  Denies LOF  +FM  States she is scheduled for induction at 130 today

## 2017-01-21 NOTE — Progress Notes (Signed)
Patient ID: Madison Bishop, female   DOB: Jul 26, 1990, 26 y.o.   MRN: [id number]  CTSP for foley placement due to difficulty finding urethra 2/2 infibulation; pt comfortable w/ epidural  BP 118/68, other VSS FHR 120s, +accels, no decels, +LTV Irreg ctx Cx 4cm; AROM performed for sm clear/cloudy fluid and Foley cath placed- both by Ralene BatheK Newton MD  IUP@term  GBS neg Latent labor  Will start 2x2 Pit to augment labor Anticipate SVD  Madison Bishop, Madison Bishop CNM 01/21/2017 3:11 PM

## 2017-01-21 NOTE — H&P (Signed)
LABOR AND DELIVERY ADMISSION HISTORY AND PHYSICAL NOTE  Madison Bishop is a 26 y.o. female G2P1001 with IUP at 6375w3d by US at 9314w6d presenting for SOL.  She reports positive fetal movement. She denies leakage of fluid. Reports bloody show when she wipes    Prenatal History/Complications: PNC at [place] Pregnancy complications:  - Female genital circumcision, Rh negative   Past Medical History: Past Medical History:  Diagnosis Date  . Medical history non-contributory     Past Surgical History: Past Surgical History:  Procedure Laterality Date  . NO PAST SURGERIES      Obstetrical History: OB History    Gravida Para Term Preterm AB Living   2 1 1     1    SAB TAB Ectopic Multiple Live Births           1      Social History: Social History   Socioeconomic History  . Marital status: Married    Spouse name: Not on file  . Number of children: Not on file  . Years of education: Not on file  . Highest education level: Not on file  Social Needs  . Financial resource strain: Not on file  . Food insecurity - worry: Not on file  . Food insecurity - inability: Not on file  . Transportation needs - medical: Not on file  . Transportation needs - non-medical: Not on file  Occupational History  . Not on file  Tobacco Use  . Smoking status: Never Smoker  . Smokeless tobacco: Never Used  Substance and Sexual Activity  . Alcohol use: No  . Drug use: No  . Sexual activity: Not on file  Other Topics Concern  . Not on file  Social History Narrative  . Not on file    Family History: No family history on file.  Allergies: No Known Allergies  Medications Prior to Admission  Medication Sig Dispense Refill Last Dose  . acetaminophen (TYLENOL) 500 MG tablet Take 1,000 mg by mouth every 6 (six) hours as needed for mild pain, moderate pain, fever or headache.   Past Week at Unknown time  . butalbital-acetaminophen-caffeine (FIORICET, ESGIC) 50-325-40 MG tablet Take 1-2  tablets by mouth every 6 (six) hours as needed. 45 tablet 4 01/20/2017 at Unknown time  . Prenatal-DSS-FeCb-FeGl-FA (CITRANATAL BLOOM) 90-1 MG TABS Take 1 tablet by mouth daily. 30 tablet 12 Past Week at Unknown time     Review of Systems  All systems reviewed and negative except as stated in HPI  Physical Exam Blood pressure 110/69, pulse 94, temperature 97.7 F (36.5 C), temperature source Oral, resp. rate 16, last menstrual period 05/03/2016, SpO2 99 %. General appearance: alert, cooperative and no distress Lungs: clear to auscultation bilaterally Heart: regular rate and rhythm Abdomen: soft, non-tender; bowel sounds normal Extremities: No calf swelling or tenderness Presentation: cephalic by cervical exam  Fetal monitoring: 130/ moderate/ +accel with no decel  Uterine activity: 4-7 minutes  Dilation: 5 Effacement (%): 80 Station: -2 Exam by:: Steward DroneVeronica Marris Frontera, CNM  Prenatal labs: ABO, Rh: --/--/AB NEG (12/19 1120) Antibody: PENDING (12/19 1120) Rubella: 27.90 (06/18 1547) RPR: Non Reactive (10/03 1018)  HBsAg: Negative (06/18 1547)  HIV:   Non reactive (10/03) GC/Chlamydia: Negative (11/21) GBS: Negative (12/19 1133)  2 hr Glucola: Normal  Genetic screening:  Negative  Anatomy US: Normal, female   Clinic [facility] Prenatal Labs  Dating US 2714w6d Blood type: AB/Negative/-- (06/18 1547)   Genetic Screen AFP:neg  NIPS: Antibody:Negative (06/18 1547)neg  Anatomic US  Normal; female fetus EFW: 28% at 35 wks Rubella: 27.90 (06/18 1547)Immune  GTT  Third trimester: WNL RPR: Non Reactive (10/03 1018) NR  Flu vaccine  10/15/16 HBsAg: Negative (06/18 1547) Neg  TDaP vaccine  11/05/16                                   Rhogam:11/19/16 HIV:   NR  Baby Food Breast                                              WUJ:WJXBJYNWGBS:Negative (11/21 1200)  Contraception POP - pills Pap: 03/2016 at [facility] requested  Circumcision Yes   Pediatrician [facility]   Support Person [place]hal[place]-  Husband   Prenatal Classes     Prenatal Transfer Tool  Maternal Diabetes: No Genetic Screening: Normal Maternal Ultrasounds/Referrals: Normal Fetal Ultrasounds or other Referrals:  None Maternal Substance Abuse:  No Significant Maternal Medications:  None Significant Maternal Lab Results: Lab values include: Group B Strep negative  Results for orders placed or performed during the hospital encounter of 01/21/17 (from the past 24 hour(s))  CBC   Collection Time: 01/21/17 11:20 AM  Result Value Ref Range   WBC 7.9 4.0 - 10.5 K/uL   RBC 3.79 (L) 3.87 - 5.11 MIL/uL   Hemoglobin 10.3 (L) 12.0 - 15.0 g/dL   HCT 29.531.0 (L) 62.136.0 - 30.846.0 %   MCV 81.8 78.0 - 100.0 fL   MCH 27.2 26.0 - 34.0 pg   MCHC 33.2 30.0 - 36.0 g/dL   RDW 65.712.9 84.611.5 - 96.215.5 %   Platelets 376 150 - 400 K/uL  Type and screen [facility]   Collection Time: 01/21/17 11:20 AM  Result Value Ref Range   ABO/RH(D) AB NEG    Antibody Screen PENDING    Sample Expiration 01/24/2017   OB RESULT CONSOLE Group B Strep   Collection Time: 01/21/17 11:33 AM  Result Value Ref Range   GBS Negative     Patient Active Problem List   Diagnosis Date Noted  . Normal labor 01/21/2017  . Low maternal weight gain 01/08/2017  . Susceptible to varicella (non-immune), currently pregnant 07/28/2016  . Encounter for supervision of other normal pregnancy, unspecified trimester 07/21/2016  . Rh negative, antepartum 07/21/2016  . 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 Language barrier 07/21/2016    Assessment: Madison Bishop is a 26 y.o. G2P1001 at 3690w3d here for SOL  #Labor: Progressing well- SOL #Pain: Epidural placed  #FWB: Cat 1 #ID:  GBS neg #MOF: Breast #MOC: POPs  #Circ:  No   Sharyon CableRogers, Lexus Barletta C, CNM 01/21/2017, 12:22 PM

## 2017-01-21 NOTE — Anesthesia Procedure Notes (Signed)
Epidural Patient location during procedure: OB  Staffing Anesthesiologist: Phillips Groutarignan, Dhanush Jokerst, MD Performed: anesthesiologist   Preanesthetic Checklist Completed: patient identified, site marked, surgical consent, pre-op evaluation, timeout performed, IV checked, risks and benefits discussed and monitors and equipment checked  Epidural Patient position: sitting Prep: DuraPrep Patient monitoring: heart rate, continuous pulse ox and blood pressure Approach: right paramedian Location: L4-L5 Injection technique: LOR saline  Needle:  Needle type: Tuohy  Needle gauge: 17 G Needle length: 9 cm and 9 Needle insertion depth: 6 cm Catheter type: closed end flexible Catheter size: 20 Guage Catheter at skin depth: 10 cm Test dose: negative  Assessment Events: blood not aspirated, injection not painful, no injection resistance, negative IV test and no paresthesia  Additional Notes Patient identified. Risks/Benefits/Options discussed with patient including but not limited to bleeding, infection, nerve damage, paralysis, failed block, incomplete pain control, headache, blood pressure changes, nausea, vomiting, reactions to medication both or allergic, itching and postpartum back pain. Confirmed with bedside nurse the patient's most recent platelet count. Confirmed with patient that they are not currently taking any anticoagulation, have any bleeding history or any family history of bleeding disorders. Patient expressed understanding and wished to proceed. All questions were answered. Sterile technique was used throughout the entire procedure. Please see nursing notes for vital signs. Test dose was given through epidural needle and negative prior to continuing to dose epidural or start infusion. Warning signs of high block given to the patient including shortness of breath, tingling/numbness in hands, complete motor block, or any concerning symptoms with instructions to call for help. Patient was given  instructions on fall risk and not to get out of bed. All questions and concerns addressed with instructions to call with any issues via ipad interpreter.

## 2017-01-22 LAB — RPR: RPR Ser Ql: NONREACTIVE

## 2017-01-22 NOTE — Progress Notes (Signed)
POSTPARTUM PROGRESS NOTE  Post Partum Day 1  Subjective:  Madison Bishop is a 26 y.o. [id number] s/p SVD at 3475w3d.  No acute events overnight.  Pt denies problems with ambulating, voiding or po intake.  She denies nausea or vomiting.  Pain is well controlled.  Lochia Moderate.   Interpreter services used (Arabic).  Objective: Blood pressure (!) 113/52, pulse 63, temperature 98.4 F (36.9 C), temperature source Oral, resp. rate 18, height 5\' 5"  (1.651 m), weight 156 lb (70.8 kg), last menstrual period 05/03/2016, SpO2 100 %, unknown if currently breastfeeding.  Physical Exam:  General: alert, cooperative and no distress Chest: no respiratory distress Heart:regular rate, distal pulses intact Abdomen: soft, nontender,  Uterine Fundus: firm, appropriately tender DVT Evaluation: No calf swelling or tenderness Extremities: no edema Skin: warm, dry  Recent Labs    01/21/17 1120  HGB 10.3*  HCT 31.0*    Assessment/Plan: Madison Bishop is a 26 y.o. [id number] s/p SVD at 3475w3d   PPD#1 - Doing well Contraception: POPs Feeding: breast Dispo: Plan for discharge tomorrow.   LOS: 1 day   Kandra NicolasJulie P DegeleMD 01/22/2017, 6:52 AM

## 2017-01-22 NOTE — Anesthesia Postprocedure Evaluation (Signed)
Anesthesia Post Note  Patient: Madison Bishop  Procedure(s) Performed: AN AD HOC LABOR EPIDURAL     Patient location during evaluation: Mother Baby Anesthesia Type: Epidural Level of consciousness: awake and alert and oriented Pain management: satisfactory to patient Vital Signs Assessment: post-procedure vital signs reviewed and stable Respiratory status: spontaneous breathing and nonlabored ventilation Cardiovascular status: stable Postop Assessment: no headache, no backache, no signs of nausea or vomiting, adequate PO intake and patient able to bend at knees (patient up walking) Anesthetic complications: no    Last Vitals:  Vitals:   01/21/17 2305 01/22/17 0308  BP: (!) 105/45 (!) 113/52  Pulse: 73 63  Resp: 16 18  Temp: 37.7 C 36.9 C  SpO2:      Last Pain:  Vitals:   01/22/17 0554  TempSrc:   PainSc: 8    Pain Goal: Patients Stated Pain Goal: 2 (01/21/17 1049)               Madison HickmanGREGORY,Cherae Marton

## 2017-01-22 NOTE — Lactation Note (Signed)
This note was copied from a baby's chart. Lactation Consultation Note Baby 10 hrs old. FOB stated he would interpret for mom. DEXTER in rm. FOB stated he was ok, understood.   Mom BF her 1st child now 496 yrs old for 20 months. No difficulty. Mom stated BF this baby good. Mom has round breast w/everted nipples, easily expressed colostrum. explaiend to parents how good colostrum was for baby, mature milk comes 3-5 days. Mom encouraged to feed baby 8-12 times/24 hours and with feeding cues.  Encouraged STS. I&O. Encouraged mom to call for assistance if needed. WH/LC brochure given w/resources, support groups and LC services. Patient Name: Madison Bishop ZOXWR'UToday's Date: 01/22/2017 Reason for consult: Initial assessment   Maternal Data Has patient been taught Hand Expression?: Yes Does the patient have breastfeeding experience prior to this delivery?: Yes  Feeding    LATCH Score       Type of Nipple: Everted at rest and after stimulation  Comfort (Breast/Nipple): Soft / non-tender        Interventions Interventions: Breast feeding basics reviewed;Breast compression;Hand express  Lactation Tools Discussed/Used     Consult Status Consult Status: Follow-up Date: 01/23/17 Follow-up type: In-patient    Charyl DancerCARVER, [name] 01/22/2017, 6:16 AM

## 2017-01-23 LAB — TYPE AND SCREEN
ABO/RH(D): AB NEG
Antibody Screen: POSITIVE
Unit division: 0
Unit division: 0

## 2017-01-23 LAB — BPAM RBC
Blood Product Expiration Date: 201812252359
Blood Product Expiration Date: 201812252359
ISSUE DATE / TIME: 201812211102
Unit Type and Rh: 9500
Unit Type and Rh: 9500

## 2017-01-23 MED ORDER — IBUPROFEN 600 MG PO TABS: 600.0000 mg | ORAL_TABLET | Freq: Four times a day (QID) | ORAL | 0 refills | Status: DC | PRN

## 2017-01-23 MED ORDER — BENZOCAINE-MENTHOL 20-0.5 % EX AERO: 1.0000 "application " | INHALATION_SPRAY | CUTANEOUS | 1 refills | Status: DC | PRN

## 2017-01-23 NOTE — Discharge Summary (Signed)
OB Discharge Summary     Patient Name: Madison Bishop DOB: March 14, 1990 MRN: [id number]  Date of admission: 01/21/2017 Delivering MD: Pincus LargePHELPS, JAZMA Y   Date of discharge: 01/23/2017  Admitting diagnosis: LABOR Intrauterine pregnancy: 4868w3d     Secondary diagnosis:  Principal Problem:   SVD (spontaneous vaginal delivery) Active Problems:   Normal labor  Additional problems: female circ type 3 repaired     Discharge diagnosis: Term Pregnancy Delivered                                                                                                Post partum procedures:n/a  Augmentation: Pitocin and Cytotec  Complications: None  Hospital course: Spontaneous onset of Labor With Vaginal Delivery   26 y.o. yo G2P2002 at 4168w3d was admitted to the hospital 01/21/2017 for onset of labor.  Patient had an uncomplicated labor course as follows: Membrane Rupture Time/Date: 3:01 PM ,01/21/2017   Intrapartum Procedures: Episiotomy: Median [2]                                         Lacerations:  2nd degree [3]   Female circ repaired Patient had delivery of a Viable infant.  Information for the patient's newborn:  Crista LuriaKhalid, Boy Salle [[id number]][[id number]]  Delivery Method: Vaginal, Spontaneous(Filed from Delivery Summary)   01/21/2017  Details of delivery can be found in separate delivery note.  Patient had a routine postpartum course. Patient is discharged home 01/23/17.  Physical exam  Vitals:   01/22/17 0308 01/22/17 0950 01/22/17 1706 01/23/17 0534  BP: (!) 113/52 100/65 116/77 108/63  Pulse: 63 71 74 77  Resp: 18 18 16 16   Temp: 98.4 F (36.9 C) 97.9 F (36.6 C) 98 F (36.7 C) 98 F (36.7 C)  TempSrc: Oral Oral Oral Oral  SpO2:   100%   Weight:      Height:       General: alert, cooperative and no distress Lochia: appropriate Uterine Fundus: soft Incision: N/A DVT Evaluation: No evidence of DVT seen on physical exam. Labs: Lab Results  Component Value Date   WBC  7.9 01/21/2017   HGB 10.3 (L) 01/21/2017   HCT 31.0 (L) 01/21/2017   MCV 81.8 01/21/2017   PLT 376 01/21/2017   No flowsheet data found.  Discharge instruction: per After Visit Summary and "Baby and Me Booklet".  After visit meds:  Allergies as of 01/23/2017   No Known Allergies     Medication List    STOP taking these medications   butalbital-acetaminophen-caffeine 50-325-40 MG tablet Commonly known as:  FIORICET, ESGIC     TAKE these medications   acetaminophen 500 MG tablet Commonly known as:  TYLENOL Take 1,000 mg by mouth every 6 (six) hours as needed for mild pain, moderate pain, fever or headache.   benzocaine-Menthol 20-0.5 % Aero Commonly known as:  DERMOPLAST Apply 1 application topically as needed for irritation (perineal discomfort).   CITRANATAL BLOOM 90-1 MG Tabs Take 1 tablet by  mouth daily.   ibuprofen 600 MG tablet Commonly known as:  ADVIL,MOTRIN Take 1 tablet (600 mg total) by mouth every 6 (six) hours as needed for mild pain or cramping.       Diet: routine diet  Activity: Advance as tolerated. Pelvic rest for 6 weeks.   Outpatient follow up:4wks, message sent to primary obgyn Follow up Appt:No future appointments. Follow up Visit:No Follow-up on file.  Postpartum contraception: Progesterone only pills  Newborn Data: Live born female  Birth Weight: 7 lb 6.2 oz (3351 g) APGAR: 9, 9  Newborn Delivery   Birth date/time:  01/21/2017 19:33:00 Delivery type:  Vaginal, Spontaneous     Baby Feeding: Breast Disposition:home with mother   01/23/2017 Marthenia RollingScott Akera Snowberger, DO

## 2017-01-23 NOTE — Discharge Instructions (Signed)
Vaginal Delivery, Care After Refer to this sheet in the next few weeks. These discharge instructions provide you with information on caring for yourself after delivery. Your caregiver may also give you specific instructions. Your treatment has been planned according to the most current medical practices available, but problems sometimes occur. Call your caregiver if you have any problems or questions after you go home. HOME CARE INSTRUCTIONS 1. Take over-the-counter or prescription medicines only as directed by your caregiver or pharmacist. 2. Do not drink alcohol, especially if you are breastfeeding or taking medicine to relieve pain. 3. Do not smoke tobacco. 4. Continue to use good perineal care. Good perineal care includes: 1. Wiping your perineum from back to front 2. Keeping your perineum clean. 3. You can do sitz baths twice a day, to help keep this area clean 5. Do not use tampons, douche or have sex until your caregiver says it is okay. 6. Shower only and avoid sitting in submerged water, aside from sitz baths 7. Wear a well-fitting bra that provides breast support. 8. Eat healthy foods. 9. Drink enough fluids to keep your urine clear or pale yellow. 10. Eat high-fiber foods such as whole grain cereals and breads, brown rice, beans, and fresh fruits and vegetables every day. These foods may help prevent or relieve constipation. 11. Avoid constipation with high fiber foods or medications, such as miralax or metamucil 12. Follow your caregiver's recommendations regarding resumption of activities such as climbing stairs, driving, lifting, exercising, or traveling. 13. Talk to your caregiver about resuming sexual activities. Resumption of sexual activities is dependent upon your risk of infection, your rate of healing, and your comfort and desire to resume sexual activity. 14. Try to have someone help you with your household activities and your newborn for at least a few days after you leave  the hospital. 15. Rest as much as possible. Try to rest or take a nap when your newborn is sleeping. 16. Increase your activities gradually. 17. Keep all of your scheduled postpartum appointments. It is very important to keep your scheduled follow-up appointments. At these appointments, your caregiver will be checking to make sure that you are healing physically and emotionally. SEEK MEDICAL CARE IF:   You are passing large clots from your vagina. Save any clots to show your caregiver.  You have a foul smelling discharge from your vagina.  You have trouble urinating.  You are urinating frequently.  You have pain when you urinate.  You have a change in your bowel movements.  You have increasing redness, pain, or swelling near your vaginal incision (episiotomy) or vaginal tear.  You have pus draining from your episiotomy or vaginal tear.  Your episiotomy or vaginal tear is separating.  You have painful, hard, or reddened breasts.  You have a severe headache.  You have blurred vision or see spots.  You feel sad or depressed.  You have thoughts of hurting yourself or your newborn.  You have questions about your care, the care of your newborn, or medicines.  You are dizzy or light-headed.  You have a rash.  You have nausea or vomiting.  You were breastfeeding and have not had a menstrual period within 12 weeks after you stopped breastfeeding.  You are not breastfeeding and have not had a menstrual period by the 12th week after delivery.  You have a fever. SEEK IMMEDIATE MEDICAL CARE IF:   You have persistent pain.  You have chest pain.  You have shortness of breath.    You faint.  You have leg pain.  You have stomach pain.  Your vaginal bleeding saturates two or more sanitary pads in 1 hour. MAKE SURE YOU:   Understand these instructions.  Will watch your condition.  Will get help right away if you are not doing well or get worse. Document Released:  01/18/2000 Document Revised: 06/06/2013 Document Reviewed: 09/17/2011 ExitCare Patient Information 2015 [employer]. This information is not intended to replace advice given to you by your health care provider. Make sure you discuss any questions you have with your health care provider.  Sitz Bath A sitz bath is a warm water bath taken in the sitting position. The water covers only the hips and butt (buttocks). We recommend using one that fits in the toilet, to help with ease of use and cleanliness. It may be used for either healing or cleaning purposes. Sitz baths are also used to relieve pain, itching, or muscle tightening (spasms). The water may contain medicine. Moist heat will help you heal and relax.  HOME CARE  Take 3 to 4 sitz baths a day. 18. Fill the bathtub half-full with warm water. 19. Sit in the water and open the drain a little. 20. Turn on the warm water to keep the tub half-full. Keep the water running constantly. 21. Soak in the water for 15 to 20 minutes. 22. After the sitz bath, pat the affected area dry. GET HELP RIGHT AWAY IF: You get worse instead of better. Stop the sitz baths if you get worse. MAKE SURE YOU:  Understand these instructions.  Will watch your condition.  Will get help right away if you are not doing well or get worse. Document Released: 02/28/2004 Document Revised: 10/15/2011 Document Reviewed: 05/20/2010 ExitCare Patient Information 2015 [employer]. This information is not intended to replace advice given to you by your health care provider. Make sure you discuss any questions you have with your health care provider.    

## 2017-01-23 NOTE — Lactation Note (Signed)
This note was copied from a baby's chart. Lactation Consultation Note  Patient Name: Madison Bishop Today's Date: 01/23/2017   Arabic video interpreter used [phone]#[id number] P2, Baby 37 hours old and latched upon entering. Lips flanged.  Swallows observed.  Mother denies pain. Baby has been a little spitty.  Taught mother how to pat back and suction if needed. Encouraged breastfeeding before offering formula.  Mom encouraged to feed baby 8-12 times/24 hours and with feeding cues.  Reviewed engorgement care and monitoring voids/stools.       Maternal Data    Feeding    LATCH Score                   Interventions    Lactation Tools Discussed/Used     Consult Status      Dahlia ByesBerkelhammer, Ruth [facility] 01/23/2017, 9:30 AM

## 2017-02-20 ENCOUNTER — Encounter: Payer: Self-pay | Admitting: Nurse Practitioner

## 2017-02-20 ENCOUNTER — Ambulatory Visit (INDEPENDENT_AMBULATORY_CARE_PROVIDER_SITE_OTHER): Payer: Medicaid Other | Admitting: Nurse Practitioner

## 2017-02-20 DIAGNOSIS — O9102 Infection of nipple associated with the puerperium: Secondary | ICD-10-CM

## 2017-02-20 DIAGNOSIS — B3789 Other sites of candidiasis: Secondary | ICD-10-CM

## 2017-02-20 DIAGNOSIS — Z30011 Encounter for initial prescription of contraceptive pills: Secondary | ICD-10-CM

## 2017-02-20 MED ORDER — NORETHINDRONE 0.35 MG PO TABS: 1.0000 | ORAL_TABLET | Freq: Every day | ORAL | 11 refills | Status: DC

## 2017-02-20 MED ORDER — FLUCONAZOLE 200 MG PO TABS: 200.0000 mg | ORAL_TABLET | Freq: Every day | ORAL | 0 refills | Status: DC

## 2017-02-20 MED ORDER — CLOTRIMAZOLE 2 % VA CREA: 1.0000 | TOPICAL_CREAM | Freq: Every day | VAGINAL | Status: AC

## 2017-02-20 NOTE — Progress Notes (Signed)
Post Partum Exam Speaks Arabic and video interpreter used for all parts of this visit. Madison Bishop is a 27 y.o. 342P2002 female who presents for a postpartum visit. She is 4 weeks postpartum following a spontaneous vaginal delivery. I have fully reviewed the prenatal and intrapartum course. The delivery was at 39.3 gestational weeks.  Anesthesia: epidural. Postpartum course has been good. Baby's course has been good. Baby is feeding by breast. Bleeding no bleeding. Bowel function is normal. Bladder function is normal. Patient is not sexually active. Contraception method is none. Postpartum depression screening:neg  Had stiches at birth and some of those have not resolved completely but she reports no pain.  She is having shooting pain in her breasts and tenderness on her nipples.  She reports her nipples have cracks bilaterally.  She states the baby does not have white patches in the mouth and is seen next week at the pediatrician.    The following portions of the patient's history were reviewed and updated as appropriate: allergies, current medications and problem list. Last pap smear done 03-04-16 and was Normal  Review of Systems Pertinent items are noted in HPI.    Objective:  Blood pressure 105/68, pulse 79, weight 139 lb 12.8 oz (63.4 kg), unknown if currently breastfeeding.  General:  alert, cooperative and no distress   Breasts:  positive findings: nipples tender bilaterally.  The line at the base of the nipple seems intact bilaterlly but client states this has cracked and she handles her breasts very tenderly.  No redness seen.  No hard areas identified with gently papating but not a full breast exam. and No other problems noted.  No drainage, no bleeding, no break in the skin identified.  Lungs: clear to auscultation bilaterally  Heart:  regular rate and rhythm, S1, S2 normal, no murmur, click, rub or gallop  Abdomen: deferred   Vulva:  deferred  Vagina: not evaluated                    Thyroid is negative on exam.  Assessment:    Normal postpartum exam. Pap smear not done at today's visit.   Nipple yeast Encounter to begin progestin only birth control pills  Plan:   1. Contraception: Micronor - return sooner if you stop breastfeeding.  This progestin only pill is better than the patch when breastfeeding so the milk supply is not affected.  Plans to breastfeed for 3 years.  Does want another child.  Advised to wait 18 months before trying for another pregnancy. 2. Nipple yeast - will treat with clotrimazole topically tid and diflucan 200 mg PO 2 pills on day one and then once daily for 2 weeks.  Have friend read the English instructions on nipple yeast.  Have the baby's mouth checked for thrush at the visit next week.  The baby may need treatment. 3. Follow up in: 1 year or as needed. Is at normal weight and maintain weight at this level.  Please discuss varicella immunization at next visit - did not review today.

## 2017-02-20 NOTE — Patient Instructions (Addendum)
Candidiasis and Breastfeeding The Candida organism is a fungus that lives in our bodies. It produces yeast cells and is kept at healthy levels by the natural bacteria in our bodies. Candida lives in warm, dark, and moist places of the body, such as skin folds under the breast and wet nipples covered by bras or nursing bra pads. When your body's natural balance of bacteria is upset, Candidacan overgrow, causing an infection. This type of infection is called candidiasis. What increases the risk? You may be at higher risk for developing candidiasis if you or your baby has been taking antibiotic medicines, your nipples are cracked, or you are taking oral contraceptives or steroids (such as for asthma). What are the signs or symptoms?  Severe stinging or burning pain, which may be on the surface of the nipples or may be felt deep inside the breast.  Pain during, in between, or especially right after feedings.  Sharp, shooting pain that spreads (radiates) from the nipple into the breast or into the back or arm.  Nipples suddenly become sore after the first two weeks after you give birth.  Sensitive nipples that may have pain with even a light touch. Nipples may also be: ? Puffy. ? Weepy. ? Itchy. ? Blistering. ? Cracked. ? Scaly. ? Reddish. ? Shiny. ? Flaky. How is this diagnosed? The diagnosis is often made based on the symptoms. Microscopic evaluation of breast discharge or cultures may be needed. How is this treated? Yeast can be passed back and forth between a mother and her baby. The mother and baby may need treatment at the same time in order to clear up the infection, even if one does not have symptoms. Occasionally other family members (especially your sexual partner) may need to be treated at the same time. Treatment may involve:  Applying antifungal cream to your nipples after each feeding.  Washing your nipples with warm water before nursing.  Stopping nursing from the affected  breast and using a breast pump.  Keeping the affected breast empty of milk with nursing or with a breast pump.  Medicine. This may be given if your baby has thrush or diaper rash. If you are nursing and you have candidiasis, your baby should be treated for thrush even if you cannot see any white patches in the baby's mouth.  If your infection is more severe, you may be prescribed medicines by mouth.  Talk to your health care provider before starting treatment. It is important to begin treatment only after making sure other things are not the cause of the problem. Follow these instructions at home: Usually after 24-48 hours, you should feel some improvement. In some cases, symptoms may get worse before they get better.  Only take medicines as directed by your health care provider. Make sure to finish all your medicines.  Only take over-the-counter or prescription medicines for pain, discomfort, or fever as directed by your health care provider.  Give your child medicines as directed by your health care provider. Make sure your child finishes them as directed by your health care provider.  Use creams or ointments as suggested by your health care provider.  Make sure your baby is seen and treated at the same time as you.  Wash your hands often. Wash them before and after nursing and changing your baby's diaper and after using the bathroom. Use hot, soapy water. Use soft towels or cloths to pat yourself dry.  Wash your baby's hands often, especially if he or she   sucks on his or her fingers.  If your baby uses a pacifier, it should be boiled for 20 minutes a day and replaced every week.  Nurse more often but for shorter periods of time. Start nursing on the least sore side.  Wash your breast pump and all its parts thoroughly in a bleach solution. Boil all parts that touch the milk (except the rubber gaskets).  If nursing becomes too painful, you may want to pump your milk temporarily and  feed it to your baby. Do not save or freeze this milk because if given to the baby after treatment is completed, it could cause the infection to return.  Eat yogurt that has live active cultures and take oral acidophilus.  Air dry your nipples after nursing.  Change bra pads after each feeding.  Wear 100% cotton bras and wash them every day in hot water.  Wash any towels or clothing that comes in contact with the infected area in very hot water (above 122F [50C]).  Contact a health care provider if: Seek medical care if:  You or your baby are not getting better or are getting worse with the treatment.  Your breasts develop shooting pains, discomfort, itching, or burning after you take antibiotics.  Get help right away if: Seek immediate medical care if:  You have a fever or persistent symptoms for more than 2-3 days.  You have a fever and your symptoms suddenly get worse.  You develop swelling and severe pain in your breast.  You develop blisters on your breast.  You feel a lump in your breast, with or without pain.  Your nipple starts bleeding.  This information is not intended to replace advice given to you by your health care provider. Make sure you discuss any questions you have with your health care provider. Document Released: 05/17/2004 Document Revised: 06/28/2015 Document Reviewed: 07/14/2012 Elsevier Interactive Patient Education  2018 [employer]vier Inc.  

## 2017-12-05 ENCOUNTER — Emergency Department (HOSPITAL_COMMUNITY)
Admission: EM | Admit: 2017-12-05 | Discharge: 2017-12-06 | Disposition: A | Discharge status: Home / Self Care | Payer: Self-pay | Attending: Emergency Medicine | Admitting: Emergency Medicine

## 2017-12-05 ENCOUNTER — Encounter (HOSPITAL_COMMUNITY): Payer: Self-pay | Admitting: Emergency Medicine

## 2017-12-05 ENCOUNTER — Emergency Department (HOSPITAL_COMMUNITY): Payer: Self-pay

## 2017-12-05 DIAGNOSIS — Z79899 Other long term (current) drug therapy: Secondary | ICD-10-CM | POA: Insufficient documentation

## 2017-12-05 DIAGNOSIS — J189 Pneumonia, unspecified organism: Secondary | ICD-10-CM | POA: Insufficient documentation

## 2017-12-05 DIAGNOSIS — N390 Urinary tract infection, site not specified: Secondary | ICD-10-CM | POA: Insufficient documentation

## 2017-12-05 DIAGNOSIS — R319 Hematuria, unspecified: Secondary | ICD-10-CM | POA: Insufficient documentation

## 2017-12-05 LAB — CBC WITH DIFFERENTIAL/PLATELET
Abs Immature Granulocytes: 0.02 10*3/uL (ref 0.00–0.07)
BASOS ABS: 0 10*3/uL (ref 0.0–0.1)
BASOS PCT: 1 %
EOS ABS: 0 10*3/uL (ref 0.0–0.5)
Eosinophils Relative: 0 %
HCT: 40.5 % (ref 36.0–46.0)
Hemoglobin: 13 g/dL (ref 12.0–15.0)
IMMATURE GRANULOCYTES: 0 %
LYMPHS ABS: 1.7 10*3/uL (ref 0.7–4.0)
Lymphocytes Relative: 27 %
MCH: 26.3 pg (ref 26.0–34.0)
MCHC: 32.1 g/dL (ref 30.0–36.0)
MCV: 82 fL (ref 80.0–100.0)
Monocytes Absolute: 0.6 10*3/uL (ref 0.1–1.0)
Monocytes Relative: 9 %
NEUTROS PCT: 63 %
NRBC: 0 % (ref 0.0–0.2)
Neutro Abs: 3.9 10*3/uL (ref 1.7–7.7)
PLATELETS: 394 10*3/uL (ref 150–400)
RBC: 4.94 MIL/uL (ref 3.87–5.11)
RDW: 12.7 % (ref 11.5–15.5)
WBC: 6.3 10*3/uL (ref 4.0–10.5)

## 2017-12-05 LAB — URINALYSIS, ROUTINE W REFLEX MICROSCOPIC
Bilirubin Urine: NEGATIVE
Glucose, UA: NEGATIVE mg/dL
Ketones, ur: NEGATIVE mg/dL
NITRITE: NEGATIVE
PROTEIN: NEGATIVE mg/dL
Specific Gravity, Urine: 1.02 (ref 1.005–1.030)
pH: 5 (ref 5.0–8.0)

## 2017-12-05 LAB — COMPREHENSIVE METABOLIC PANEL
ALK PHOS: 80 U/L (ref 38–126)
ALT: 18 U/L (ref 0–44)
ANION GAP: 10 (ref 5–15)
AST: 22 U/L (ref 15–41)
Albumin: 3.9 g/dL (ref 3.5–5.0)
BUN: 10 mg/dL (ref 6–20)
CALCIUM: 9.2 mg/dL (ref 8.9–10.3)
CO2: 22 mmol/L (ref 22–32)
Chloride: 103 mmol/L (ref 98–111)
Creatinine, Ser: 0.7 mg/dL (ref 0.44–1.00)
GFR calc non Af Amer: >60 mL/min (ref >60)
Glucose, Bld: 106 mg/dL — ABNORMAL HIGH (ref 70–99)
Potassium: 3.4 mmol/L — ABNORMAL LOW (ref 3.5–5.1)
SODIUM: 135 mmol/L (ref 135–145)
TOTAL PROTEIN: 7.4 g/dL (ref 6.5–8.1)
Total Bilirubin: 0.5 mg/dL (ref 0.3–1.2)

## 2017-12-05 LAB — LIPASE, BLOOD: Lipase: 32 U/L (ref 11–51)

## 2017-12-05 LAB — I-STAT CG4 LACTIC ACID, ED: Lactic Acid, Venous: 1.46 mmol/L (ref 0.5–1.9)

## 2017-12-05 LAB — I-STAT BETA HCG BLOOD, ED (MC, WL, AP ONLY)

## 2017-12-05 MED ORDER — ACETAMINOPHEN 325 MG PO TABS
650.0000 mg | ORAL_TABLET | Freq: Once | ORAL | Status: AC
  Administered 2017-12-05: 650 mg via ORAL
  Filled 2017-12-05: qty 2

## 2017-12-05 MED ORDER — LACTATED RINGERS IV BOLUS
1000.0000 mL | Freq: Once | INTRAVENOUS | Status: AC
  Administered 2017-12-05: 1000 mL via INTRAVENOUS

## 2017-12-05 NOTE — ED Provider Notes (Signed)
[facility] EMERGENCY DEPARTMENT Provider Note   CSN: [id number] Arrival date & time: 12/05/17  1825     History   Chief Complaint Chief Complaint  Patient presents with  . Fever    HPI Madison Bishop is a 27 y.o. female.  HPI Patient is a 27 year old female with no significant past medical history who presents to the emergency department for evaluation of fever, epigastric pain, back pain, and cough.  Patient reports that she has had the symptoms since Thursday.  In regards to her fever, states that it is been constant since the onset on Thursday.  She has not tried any medications prior to arrival.  She got Tylenol in triage with significant improvement to her overall malaise.  In regards to patient's epigastric pain, she states that this started yesterday.  Reports that it does radiate into her back.  Secondary to the pain in this area she has had decreased p.o. intake for the past few days.  She denies any significant nausea or vomiting.  She denies any worsening of her symptoms with eating or drinking.  Patient reports that the cough is also been present for the past few days.  It is nonproductive and is getting somewhat worse.  Her only other symptom that she reports that this time his dysuria that has been present for the past few days as well.  Remaining review of systems is as below.  Past Medical History:  Diagnosis Date  . Low maternal weight gain 01/08/2017  . Medical history non-contributory   . Normal labor 01/21/2017  . Rh negative, antepartum 07/21/2016   Rhogam at 28 weeks  . SVD (spontaneous vaginal delivery) 01/22/2017    Patient Active Problem List   Diagnosis Date Noted  . Susceptible to varicella (non-immune), currently pregnant 07/28/2016  . 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 Language barrier 07/21/2016    Past Surgical History:  Procedure Laterality Date  . NO PAST SURGERIES       OB History    Gravida  2    Para  2   Term  2   Preterm      AB      Living  2     SAB      TAB      Ectopic      Multiple  0   Live Births  2            Home Medications    Prior to Admission medications   Medication Sig Start Date End Date Taking? Authorizing Provider  norethindrone (MICRONOR,CAMILA,ERRIN) 0.35 MG tablet Take 1 tablet (0.35 mg total) by mouth daily. 02/20/17  Yes Burleson, Terri L, NP  benzocaine-Menthol (DERMOPLAST) 20-0.5 % AERO Apply 1 application topically as needed for irritation (perineal discomfort). Patient not taking: Reported on 02/20/2017 01/23/17   [place] Bing, MD  fluconazole (DIFLUCAN) 200 MG tablet Take 1 tablet (200 mg total) by mouth daily. Take 2 tablets on the first day then one per day Patient not taking: Reported on 12/05/2017 02/20/17   Currie Paris, NP  ibuprofen (ADVIL,MOTRIN) 600 MG tablet Take 1 tablet (600 mg total) by mouth every 6 (six) hours as needed for mild pain or cramping. Patient not taking: Reported on 02/20/2017 01/23/17   [place] Bing, MD  levofloxacin (LEVAQUIN) 750 MG tablet Take 1 tablet (750 mg total) by mouth daily. 12/06/17   Antony Madura, PA-C  Prenatal-DSS-FeCb-FeGl-FA (CITRANATAL BLOOM) 90-1 MG TABS Take 1 tablet  by mouth daily. Patient not taking: Reported on 02/20/2017 11/06/16   Roe Coombs, CNM    Family History History reviewed. No pertinent family history.  Social History Social History   Tobacco Use  . Smoking status: Never Smoker  . Smokeless tobacco: Never Used  Substance Use Topics  . Alcohol use: No  . Drug use: No     Allergies   Patient has no known allergies.   Review of Systems Review of Systems  Constitutional: Positive for appetite change, fatigue and fever. Negative for chills.  HENT: Negative for congestion, ear pain and trouble swallowing.   Eyes: Negative for pain and visual disturbance.  Respiratory: Positive for cough. Negative for shortness of breath.   Cardiovascular:  Negative for chest pain and palpitations.  Gastrointestinal: Positive for abdominal pain. Negative for constipation, diarrhea, nausea and vomiting.  Endocrine: Negative for polyuria.  Genitourinary: Positive for dysuria. Negative for flank pain, hematuria and vaginal discharge.  Musculoskeletal: Negative for arthralgias and back pain.  Skin: Negative for color change and rash.  Neurological: Negative for seizures and syncope.  All other systems reviewed and are negative.    Physical Exam Updated Vital Signs BP 111/66   Pulse 63   Temp 97.9 F (36.6 C) (Oral)   Resp 14   SpO2 100%   Physical Exam  Constitutional: She appears well-developed and well-nourished. No distress.  HENT:  Head: Normocephalic and atraumatic.  Eyes: Conjunctivae are normal.  Neck: Neck supple.  Cardiovascular: Regular rhythm. Tachycardia present.  No murmur heard. Pulmonary/Chest: Effort normal and breath sounds normal. No respiratory distress.  Abdominal: Soft. She exhibits no distension and no mass. There is tenderness (Epigastic and RUQ pain on exam. ). There is no rebound.  Mild right flank tenderness.   Musculoskeletal: She exhibits no edema.  Paraspinal muscle tenderness in the thoracic spine.   Neurological: She is alert.  Skin: Skin is warm and dry.  Psychiatric: She has a normal mood and affect.  Nursing note and vitals reviewed.    ED Treatments / Results  Labs (all labs ordered are listed, but only abnormal results are displayed) Labs Reviewed  COMPREHENSIVE METABOLIC PANEL - Abnormal; Notable for the following components:      Result Value   Potassium 3.4 (*)    Glucose, Bld 106 (*)    All other components within normal limits  URINALYSIS, ROUTINE W REFLEX MICROSCOPIC - Abnormal; Notable for the following components:   APPearance CLOUDY (*)    Hgb urine dipstick MODERATE (*)    Leukocytes, UA TRACE (*)    Bacteria, UA MANY (*)    All other components within normal limits  CBC  WITH DIFFERENTIAL/PLATELET  LIPASE, BLOOD  I-STAT CG4 LACTIC ACID, ED  I-STAT BETA HCG BLOOD, ED ([facility], [facility], [facility] ONLY)  I-STAT BETA HCG BLOOD, ED ([facility], [facility], [facility] ONLY)    EKG None  Radiology Dg Chest 2 View  Result Date: 12/05/2017 CLINICAL DATA:  Epigastric pain for several days, no known injury, initial encounter EXAM: CHEST - 2 VIEW COMPARISON:  None. FINDINGS: Cardiac shadows within normal limits. The lungs are well aerated bilaterally. Some minimal left retrocardiac atelectasis/infiltrate is noted. No sizable effusion is seen. No bony abnormality is noted. IMPRESSION: Minimal increased left retrocardiac opacity as described. Electronically Signed   By: Alcide Clever M.D.   On: 12/05/2017 19:39   Ct Renal Stone Study (PENDING)   Procedures Procedures (including critical care time)  Medications Ordered in ED Medications  acetaminophen (  TYLENOL) tablet 650 mg (650 mg Oral Given 12/05/17 1855)  lactated ringers bolus 1,000 mL (0 mLs Intravenous Stopped 12/06/17 0034)     Initial Impression / Assessment and Plan / ED Course  I have reviewed the triage vital signs and the nursing notes.  Pertinent labs & imaging results that were available during my care of the patient were reviewed by me and considered in my medical decision making (see chart for details).     Patient is a previously healthy 27 year old female who presents to the emergency department for evaluation of fever since Thursday and multiple symptoms.  Patient has had cough, dysuria, epigastric pain, back pain, and right flank pain that is been going on since the onset of her fever.  Secondary to patient's arrival complaint multiple laboratory and imaging studies were obtained.  The abnormal results are listed above.  Patient's chest x-ray shows a retrocardiac opacity concerning for possible pneumonia.  We performed a bedside ultrasound to evaluate for possible gallbladder disease, however patient's ultrasound does not reveal  gallstones or a thickened anterior gallbladder wall.  Patient's urine does not appear to be a clean-catch, however he does have blood and many bacteria.  In the setting of patient having dysuria as well as right flank pain we ordered a CT stone study to rule out the possibility of an infected stone.  The results of the study are pending at time of my shift's end.  I transitioned patient's care to the oncoming provider at approximately 0045.  At that time patient is resting comfortably.  Her tachycardia has improved following IV fluids.  My plan at that time is to treat patient with Levaquin for community-acquired pneumonia as well as a possible urinary tract infection.  For further details regarding patient's continued emergency department course please see the oncoming providers documentation.  At this time I believe patient's symptoms are most likely related to a left lower lobe pneumonia.  It is also possible that patient has a urinary tract infection based off of her urinalysis and dysuria.  Other intra-abdominal processes for patient's pain considered including biliary disease, gallbladder disease, pancreatitis, and other intra-abdominal infections.  I feel that these are less likely given the findings on patient's laboratory and imaging studies.  I feel patient will be appropriate for outpatient antibiotic therapy and follow-up with her primary care physician.  The care of this patient was discussed with my attending physician Dr. Jodi Mourning, who voices agreement with work-up and ED disposition.  Final Clinical Impressions(s) / ED Diagnoses   Final diagnoses:  Community acquired pneumonia of left lung, unspecified part of lung  Urinary tract infection with hematuria, site unspecified    ED Discharge Orders         Ordered    levofloxacin (LEVAQUIN) 750 MG tablet  Daily     12/06/17 0059           Keith Rake, MD 12/06/17 1232    Blane Ohara, MD 12/08/17 [phone]

## 2017-12-05 NOTE — ED Triage Notes (Signed)
Pt presents to ED For assessment of epigastric pain and back pain since Thursday with frequent urination and burning with urination.  Patient is currently breastfeeding and has not had a cycle since her delivery.

## 2017-12-06 MED ORDER — LEVOFLOXACIN 750 MG PO TABS: 750.0000 mg | ORAL_TABLET | Freq: Every day | ORAL | 0 refills | Status: DC

## 2017-12-06 NOTE — ED Provider Notes (Signed)
1:15 AM Patient care assumed at shift change.  Pending CT scan to evaluate because of febrile illness.  CT shows multifocal pneumonia as visualized on x-ray.  There is mild right hydronephrosis without obstructing ureterolithiasis.  Question recently passed kidney stone given scant hematuria on urinalysis.  Plan for discharge on Levaquin as this will cover both pneumonia and potential urinary tract infection.  Urine seems more consistent with contamination, however.  The patient does not meet criteria for SIRS or Sepsis.  She has been instructed to follow-up with her primary care doctor.  Return precautions discussed and provided. Patient discharged in stable condition with no unaddressed concerns.  Vitals:   12/06/17 0000 12/06/17 0015 12/06/17 0030 12/06/17 0048  BP: 110/69 115/79 111/66   Pulse: 75 79 63   Resp: 17 14 14    Temp:    97.9 F (36.6 C)  TempSrc:    Oral  SpO2: 100% 100% 100%       Antony Madura, PA-C 12/06/17 0119    Eber Hong, MD 12/06/17 1649

## 2017-12-06 NOTE — Discharge Instructions (Addendum)
Your CT today showed that you have pneumonia to your left lower lung.  Take Levaquin as prescribed until finished.  This antibiotic will also provide coverage for a urinary tract infection.  Do not stop this antibiotic early.  You may use over the counter medications for cough and cold symptoms.  Take 600 mg ibuprofen every 6 hours for management of fever.  You may supplement this with Tylenol as needed.  Follow-up with your primary care doctor to ensure resolution of symptoms.

## 2018-02-03 NOTE — L&D Delivery Note (Signed)
Delivery Note At 2:30 PM a viable female was delivered via Vaginal, Spontaneous (Presentation: Direct OP).  APGAR: 9, 9; weight  Pending.   Placenta status: spontaneous, intact.  Cord: 3 vessels   Anesthesia:  epidural Episiotomy:  n/a Lacerations: 2nd degree;Perineal Suture Repair: 3.0 vicryl 4.0 monocryl Est. 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 Decision was made to cut vertically 1 cm. Head delivered quickly after incision was made. Incision repaired per patient request.   Mom to postpartum.  Baby to Couplet care / Skin to Skin.  Wende Mott CNM 12/18/2018, 2:55 PM

## 2018-06-21 ENCOUNTER — Other Ambulatory Visit (HOSPITAL_COMMUNITY): Payer: Self-pay | Admitting: Nurse Practitioner

## 2018-06-21 DIAGNOSIS — Z369 Encounter for antenatal screening, unspecified: Secondary | ICD-10-CM

## 2018-06-22 ENCOUNTER — Encounter (HOSPITAL_COMMUNITY): Payer: Self-pay | Admitting: *Deleted

## 2018-06-22 ENCOUNTER — Encounter (HOSPITAL_COMMUNITY): Payer: Self-pay | Admitting: Emergency Medicine

## 2018-06-22 ENCOUNTER — Encounter (HOSPITAL_COMMUNITY): Payer: Self-pay

## 2018-06-24 ENCOUNTER — Encounter (HOSPITAL_COMMUNITY): Payer: Self-pay

## 2018-06-24 ENCOUNTER — Ambulatory Visit (HOSPITAL_COMMUNITY): Payer: Self-pay | Admitting: *Deleted

## 2018-06-24 ENCOUNTER — Other Ambulatory Visit: Payer: Self-pay

## 2018-06-24 ENCOUNTER — Ambulatory Visit (HOSPITAL_COMMUNITY): Payer: Self-pay

## 2018-06-24 ENCOUNTER — Ambulatory Visit (HOSPITAL_COMMUNITY)
Admission: RE | Admit: 2018-06-24 | Discharge: 2018-06-24 | Disposition: A | Discharge status: Home / Self Care | Payer: Self-pay | Source: Ambulatory Visit | Attending: Obstetrics and Gynecology | Admitting: Obstetrics and Gynecology

## 2018-06-24 VITALS — BP 94/72 | HR 77 | Temp 97.9°F

## 2018-06-24 DIAGNOSIS — Z3687 Encounter for antenatal screening for uncertain dates: Secondary | ICD-10-CM

## 2018-06-24 DIAGNOSIS — Z3682 Encounter for antenatal screening for nuchal translucency: Secondary | ICD-10-CM

## 2018-06-24 DIAGNOSIS — Z369 Encounter for antenatal screening, unspecified: Secondary | ICD-10-CM | POA: Insufficient documentation

## 2018-06-24 DIAGNOSIS — Z3A13 13 weeks gestation of pregnancy: Secondary | ICD-10-CM

## 2018-06-24 HISTORY — DX: Other seasonal allergic rhinitis: J30.2

## 2018-06-24 HISTORY — DX: Pneumonia, unspecified organism: J18.9

## 2018-06-26 LAB — FIRST TRIMESTER SCREEN W/NT
CRL: 76.4 mm
DIA MoM: 1.22
DIA Value: 255 pg/mL
Gest Age-Collect: 13.4 weeks
Maternal Age At EDD: 29.1 yr
Nuchal Translucency MoM: 0.9
Nuchal Translucency: 1.5 mm
Number of Fetuses: 1
PAPP-A MoM: 2.16
PAPP-A Value: 3259.1 ng/mL
Test Results:: NEGATIVE
Weight: 143 [lb_av]
hCG MoM: 0.91
hCG Value: 73.8 IU/mL

## 2018-06-30 LAB — OB RESULTS CONSOLE HEPATITIS B SURFACE ANTIGEN: Hepatitis B Surface Ag: NEGATIVE

## 2018-06-30 LAB — OB RESULTS CONSOLE GC/CHLAMYDIA
Chlamydia: NEGATIVE
Gonorrhea: NEGATIVE

## 2018-06-30 LAB — OB RESULTS CONSOLE RUBELLA ANTIBODY, IGM: Rubella: IMMUNE

## 2018-06-30 LAB — OB RESULTS CONSOLE HIV ANTIBODY (ROUTINE TESTING): HIV: NONREACTIVE

## 2018-07-30 ENCOUNTER — Other Ambulatory Visit: Payer: Self-pay

## 2018-07-30 ENCOUNTER — Other Ambulatory Visit: Payer: Self-pay | Admitting: Internal Medicine

## 2018-07-30 ENCOUNTER — Ambulatory Visit
Admission: RE | Admit: 2018-07-30 | Discharge: 2018-07-30 | Disposition: A | Discharge status: Home / Self Care | Payer: Self-pay | Source: Ambulatory Visit | Attending: Internal Medicine | Admitting: Internal Medicine

## 2018-07-30 DIAGNOSIS — R7611 Nonspecific reaction to tuberculin skin test without active tuberculosis: Secondary | ICD-10-CM

## 2018-11-29 LAB — OB RESULTS CONSOLE GC/CHLAMYDIA
Chlamydia: NEGATIVE
Gonorrhea: NEGATIVE

## 2018-11-29 LAB — OB RESULTS CONSOLE GBS: GBS: NEGATIVE

## 2018-12-15 ENCOUNTER — Other Ambulatory Visit: Payer: Self-pay

## 2018-12-15 ENCOUNTER — Encounter (HOSPITAL_COMMUNITY): Payer: Self-pay

## 2018-12-15 ENCOUNTER — Inpatient Hospital Stay (HOSPITAL_COMMUNITY)
Admission: AD | Admit: 2018-12-15 | Discharge: 2018-12-15 | Disposition: A | Discharge status: Home / Self Care | Payer: Medicaid Other | Attending: Obstetrics & Gynecology | Admitting: Obstetrics & Gynecology

## 2018-12-15 DIAGNOSIS — O471 False labor at or after 37 completed weeks of gestation: Secondary | ICD-10-CM | POA: Diagnosis not present

## 2018-12-15 DIAGNOSIS — Z3A38 38 weeks gestation of pregnancy: Secondary | ICD-10-CM

## 2018-12-15 LAB — URINALYSIS, ROUTINE W REFLEX MICROSCOPIC
Bilirubin Urine: NEGATIVE
Glucose, UA: NEGATIVE mg/dL
Hgb urine dipstick: NEGATIVE
Ketones, ur: NEGATIVE mg/dL
Nitrite: NEGATIVE
Protein, ur: 30 mg/dL — AB
Specific Gravity, Urine: 1.029 (ref 1.005–1.030)
pH: 6 (ref 5.0–8.0)

## 2018-12-15 NOTE — MAU Note (Signed)
Pt presents to MAU c/o ctx every 12 min since 0330. Pt denies LOF. No vaginal bleeding +FM. Pt reports some burning when she urinates that started today pt reports back pt reports some CVA tenderness on exam. The pain is a 7/10 on the pain scale.

## 2018-12-15 NOTE — Discharge Instructions (Signed)
Braxton Hicks Contractions Contractions of the uterus can occur throughout pregnancy, but they are not always a sign that you are in labor. You may have practice contractions called Braxton Hicks contractions. These false labor contractions are sometimes confused with true labor. What are Braxton Hicks contractions? Braxton Hicks contractions are tightening movements that occur in the muscles of the uterus before labor. Unlike true labor contractions, these contractions do not result in opening (dilation) and thinning of the cervix. Toward the end of pregnancy (32-34 weeks), Braxton Hicks contractions can happen more often and may become stronger. These contractions are sometimes difficult to tell apart from true labor because they can be very uncomfortable. You should not feel embarrassed if you go to the hospital with false labor. Sometimes, the only way to tell if you are in true labor is for your health care provider to look for changes in the cervix. The health care provider will do a physical exam and may monitor your contractions. If you are not in true labor, the exam should show that your cervix is not dilating and your water has not broken. If there are no other health problems associated with your pregnancy, it is completely safe for you to be sent home with false labor. You may continue to have Braxton Hicks contractions until you go into true labor. How to tell the difference between true labor and false labor True labor  Contractions last 30-70 seconds.  Contractions become very regular.  Discomfort is usually felt in the top of the uterus, and it spreads to the lower abdomen and low back.  Contractions do not go away with walking.  Contractions usually become more intense and increase in frequency.  The cervix dilates and gets thinner. False labor  Contractions are usually shorter and not as strong as true labor contractions.  Contractions are usually irregular.  Contractions  are often felt in the front of the lower abdomen and in the groin.  Contractions may go away when you walk around or change positions while lying down.  Contractions get weaker and are shorter-lasting as time goes on.  The cervix usually does not dilate or become thin. Follow these instructions at home:   Take over-the-counter and prescription medicines only as told by your health care provider.  Keep up with your usual exercises and follow other instructions from your health care provider.  Eat and drink lightly if you think you are going into labor.  If Braxton Hicks contractions are making you uncomfortable: ? Change your position from lying down or resting to walking, or change from walking to resting. ? Sit and rest in a tub of warm water. ? Drink enough fluid to keep your urine pale yellow. Dehydration may cause these contractions. ? Do slow and deep breathing several times an hour.  Keep all follow-up prenatal visits as told by your health care provider. This is important. Contact a health care provider if:  You have a fever.  You have continuous pain in your abdomen. Get help right away if:  Your contractions become stronger, more regular, and closer together.  You have fluid leaking or gushing from your vagina.  You pass blood-tinged mucus (bloody show).  You have bleeding from your vagina.  You have low back pain that you never had before.  You feel your baby's head pushing down and causing pelvic pressure.  Your baby is not moving inside you as much as it used to. Summary  Contractions that occur before labor are   called Braxton Hicks contractions, false labor, or practice contractions.  Braxton Hicks contractions are usually shorter, weaker, farther apart, and less regular than true labor contractions. True labor contractions usually become progressively stronger and regular, and they become more frequent.  Manage discomfort from Braxton Hicks contractions  by changing position, resting in a warm bath, drinking plenty of water, or practicing deep breathing. This information is not intended to replace advice given to you by your health care provider. Make sure you discuss any questions you have with your health care provider. Document Released: 06/05/2016 Document Revised: 01/02/2017 Document Reviewed: 06/05/2016 Elsevier Patient Education  2020 Elsevier Inc.  

## 2018-12-15 NOTE — MAU Provider Note (Signed)
[place] is a 28 y.o. G38P2002 female at [redacted]w[redacted]d  RN Labor check, not seen by provider SVE by RN: Dilation: 5 Effacement (%): 80 Station: -1 Exam by:: [employer], RN NST: FHR baseline 120s bpm, Variability: moderate, Accelerations:present, Decelerations:  Absent= Cat 1/Reactive Toco: irregular, every 11-13 minutes  D/C home  Myrtis Ser [facility] 12/15/2018 8:48 PM

## 2018-12-18 ENCOUNTER — Encounter (HOSPITAL_COMMUNITY): Payer: Self-pay | Admitting: *Deleted

## 2018-12-18 ENCOUNTER — Inpatient Hospital Stay (HOSPITAL_COMMUNITY): Payer: Medicaid Other | Admitting: Anesthesiology

## 2018-12-18 ENCOUNTER — Inpatient Hospital Stay (HOSPITAL_COMMUNITY)
Admission: AD | Admit: 2018-12-18 | Discharge: 2018-12-19 | DRG: 807 | Disposition: A | Discharge status: Home / Self Care | Payer: Medicaid Other | Attending: Obstetrics and Gynecology | Admitting: Obstetrics and Gynecology

## 2018-12-18 ENCOUNTER — Other Ambulatory Visit: Payer: Self-pay

## 2018-12-18 DIAGNOSIS — Z3A39 39 weeks gestation of pregnancy: Secondary | ICD-10-CM

## 2018-12-18 DIAGNOSIS — Z20828 Contact with and (suspected) exposure to other viral communicable diseases: Secondary | ICD-10-CM | POA: Diagnosis present

## 2018-12-18 DIAGNOSIS — O26893 Other specified pregnancy related conditions, third trimester: Secondary | ICD-10-CM | POA: Diagnosis present

## 2018-12-18 LAB — CBC
HCT: 33.4 % — ABNORMAL LOW (ref 36.0–46.0)
Hemoglobin: 11.2 g/dL — ABNORMAL LOW (ref 12.0–15.0)
MCH: 29 pg (ref 26.0–34.0)
MCHC: 33.5 g/dL (ref 30.0–36.0)
MCV: 86.5 fL (ref 80.0–100.0)
Platelets: 376 10*3/uL (ref 150–400)
RBC: 3.86 MIL/uL — ABNORMAL LOW (ref 3.87–5.11)
RDW: 13.7 % (ref 11.5–15.5)
WBC: 8.2 10*3/uL (ref 4.0–10.5)
nRBC: 0 % (ref 0.0–0.2)

## 2018-12-18 LAB — TYPE AND SCREEN
ABO/RH(D): AB NEG
Antibody Screen: NEGATIVE

## 2018-12-18 LAB — SARS CORONAVIRUS 2 BY RT PCR (HOSPITAL ORDER, PERFORMED IN ~~LOC~~ HOSPITAL LAB): SARS Coronavirus 2: NEGATIVE

## 2018-12-18 LAB — ABO/RH: ABO/RH(D): AB NEG

## 2018-12-18 MED ORDER — BENZOCAINE-MENTHOL 20-0.5 % EX AERO
1.0000 "application " | INHALATION_SPRAY | CUTANEOUS | Status: DC | PRN
  Administered 2018-12-18 – 2018-12-19 (×2): 1 via TOPICAL
  Filled 2018-12-18 (×2): qty 56

## 2018-12-18 MED ORDER — LIDOCAINE HCL (PF) 1 % IJ SOLN: 30.0000 mL | INTRAMUSCULAR | Status: DC | PRN

## 2018-12-18 MED ORDER — EPHEDRINE 5 MG/ML INJ: 10.0000 mg | INTRAVENOUS | Status: DC | PRN

## 2018-12-18 MED ORDER — ONDANSETRON HCL 4 MG/2ML IJ SOLN: 4.0000 mg | INTRAMUSCULAR | Status: DC | PRN

## 2018-12-18 MED ORDER — DIPHENHYDRAMINE HCL 50 MG/ML IJ SOLN: 12.5000 mg | INTRAMUSCULAR | Status: DC | PRN

## 2018-12-18 MED ORDER — FENTANYL CITRATE (PF) 100 MCG/2ML IJ SOLN: 100.0000 ug | INTRAMUSCULAR | Status: DC | PRN

## 2018-12-18 MED ORDER — FENTANYL-BUPIVACAINE-NACL 0.5-0.125-0.9 MG/250ML-% EP SOLN: 12.0000 mL/h | EPIDURAL | Status: DC | PRN

## 2018-12-18 MED ORDER — SENNOSIDES-DOCUSATE SODIUM 8.6-50 MG PO TABS
2.0000 | ORAL_TABLET | ORAL | Status: DC
  Administered 2018-12-19: 2 via ORAL
  Filled 2018-12-18: qty 2

## 2018-12-18 MED ORDER — OXYCODONE-ACETAMINOPHEN 5-325 MG PO TABS: 1.0000 | ORAL_TABLET | ORAL | Status: DC | PRN

## 2018-12-18 MED ORDER — ACETAMINOPHEN 325 MG PO TABS
650.0000 mg | ORAL_TABLET | ORAL | Status: DC | PRN
  Administered 2018-12-18: 650 mg via ORAL
  Filled 2018-12-18: qty 2

## 2018-12-18 MED ORDER — DIBUCAINE (PERIANAL) 1 % EX OINT: 1.0000 "application " | TOPICAL_OINTMENT | CUTANEOUS | Status: DC | PRN

## 2018-12-18 MED ORDER — SIMETHICONE 80 MG PO CHEW: 80.0000 mg | CHEWABLE_TABLET | ORAL | Status: DC | PRN

## 2018-12-18 MED ORDER — DIPHENHYDRAMINE HCL 25 MG PO CAPS: 25.0000 mg | ORAL_CAPSULE | Freq: Four times a day (QID) | ORAL | Status: DC | PRN

## 2018-12-18 MED ORDER — ONDANSETRON HCL 4 MG PO TABS: 4.0000 mg | ORAL_TABLET | ORAL | Status: DC | PRN

## 2018-12-18 MED ORDER — LACTATED RINGERS IV SOLN: 500.0000 mL | Freq: Once | INTRAVENOUS | Status: DC

## 2018-12-18 MED ORDER — ONDANSETRON HCL 4 MG/2ML IJ SOLN: 4.0000 mg | Freq: Four times a day (QID) | INTRAMUSCULAR | Status: DC | PRN

## 2018-12-18 MED ORDER — PRENATAL MULTIVITAMIN CH
1.0000 | ORAL_TABLET | Freq: Every day | ORAL | Status: DC
  Administered 2018-12-19: 1 via ORAL
  Filled 2018-12-18: qty 1

## 2018-12-18 MED ORDER — LIDOCAINE HCL (PF) 1 % IJ SOLN
INTRAMUSCULAR | Status: DC | PRN
  Administered 2018-12-18: 11 mL via EPIDURAL

## 2018-12-18 MED ORDER — ACETAMINOPHEN 325 MG PO TABS: 650.0000 mg | ORAL_TABLET | ORAL | Status: DC | PRN

## 2018-12-18 MED ORDER — COCONUT OIL OIL: 1.0000 "application " | TOPICAL_OIL | Status: DC | PRN

## 2018-12-18 MED ORDER — SODIUM CHLORIDE (PF) 0.9 % IJ SOLN
INTRAMUSCULAR | Status: DC | PRN
  Administered 2018-12-18: 12 mL/h via EPIDURAL

## 2018-12-18 MED ORDER — OXYCODONE-ACETAMINOPHEN 5-325 MG PO TABS: 2.0000 | ORAL_TABLET | ORAL | Status: DC | PRN

## 2018-12-18 MED ORDER — PHENYLEPHRINE 40 MCG/ML (10ML) SYRINGE FOR IV PUSH (FOR BLOOD PRESSURE SUPPORT): 80.0000 ug | PREFILLED_SYRINGE | INTRAVENOUS | Status: DC | PRN

## 2018-12-18 MED ORDER — FENTANYL-BUPIVACAINE-NACL 0.5-0.125-0.9 MG/250ML-% EP SOLN
EPIDURAL | Status: AC
  Filled 2018-12-18: qty 250

## 2018-12-18 MED ORDER — WITCH HAZEL-GLYCERIN EX PADS: 1.0000 "application " | MEDICATED_PAD | CUTANEOUS | Status: DC | PRN

## 2018-12-18 MED ORDER — OXYTOCIN 40 UNITS IN NORMAL SALINE INFUSION - SIMPLE MED
2.5000 [IU]/h | INTRAVENOUS | Status: DC
  Filled 2018-12-18: qty 1000

## 2018-12-18 MED ORDER — METHYLERGONOVINE MALEATE 0.2 MG/ML IJ SOLN
INTRAMUSCULAR | Status: AC
  Filled 2018-12-18: qty 1

## 2018-12-18 MED ORDER — SOD CITRATE-CITRIC ACID 500-334 MG/5ML PO SOLN: 30.0000 mL | ORAL | Status: DC | PRN

## 2018-12-18 MED ORDER — ZOLPIDEM TARTRATE 5 MG PO TABS: 5.0000 mg | ORAL_TABLET | Freq: Every evening | ORAL | Status: DC | PRN

## 2018-12-18 MED ORDER — LACTATED RINGERS IV SOLN
INTRAVENOUS | Status: DC
  Administered 2018-12-18: 11:00:00 via INTRAVENOUS

## 2018-12-18 MED ORDER — IBUPROFEN 600 MG PO TABS
600.0000 mg | ORAL_TABLET | Freq: Four times a day (QID) | ORAL | Status: DC
  Administered 2018-12-18 – 2018-12-19 (×5): 600 mg via ORAL
  Filled 2018-12-18 (×5): qty 1

## 2018-12-18 MED ORDER — TETANUS-DIPHTH-ACELL PERTUSSIS 5-2.5-18.5 LF-MCG/0.5 IM SUSP: 0.5000 mL | Freq: Once | INTRAMUSCULAR | Status: DC

## 2018-12-18 MED ORDER — OXYTOCIN BOLUS FROM INFUSION: 500.0000 mL | Freq: Once | INTRAVENOUS | Status: DC

## 2018-12-18 MED ORDER — FLEET ENEMA 7-19 GM/118ML RE ENEM: 1.0000 | ENEMA | RECTAL | Status: DC | PRN

## 2018-12-18 MED ORDER — LACTATED RINGERS IV SOLN: 500.0000 mL | INTRAVENOUS | Status: DC | PRN

## 2018-12-18 NOTE — H&P (Signed)
OBSTETRIC ADMISSION HISTORY AND PHYSICAL  Madison Bishop is a 28 y.o. female [phone] with IUP at [redacted]w[redacted]d by LMP presenting for spontaneous onset of labor. She reports +FMs, No LOF, no VB, no blurry vision, headaches or peripheral edema, and RUQ pain.  She plans on breast feeding. She request IUD outpatient for birth control. She received her prenatal care at [facility]   Dating: By LMP --->  Estimated Date of Delivery: 12/25/18    Prenatal History/Complications:  Past Medical History: Past Medical History:  Diagnosis Date  . Low maternal weight gain 01/08/2017  . Medical history non-contributory   . Normal labor 01/21/2017  . Pneumonia   . Rh negative, antepartum 07/21/2016   Rhogam at 28 weeks  . Seasonal allergies   . SVD (spontaneous vaginal delivery) 01/22/2017    Past Surgical History: Past Surgical History:  Procedure Laterality Date  . NO PAST SURGERIES      Obstetrical History: OB History    Gravida  3   Para  2   Term  2   Preterm  0   AB  0   Living  2     SAB  0   TAB  0   Ectopic  0   Multiple      Live Births  2           Social History Social History   Socioeconomic History  . Marital status: Unknown    Spouse name: Not on file  . Number of children: Not on file  . Years of education: Not on file  . Highest education level: Not on file  Occupational History  . Not on file  Social Needs  . Financial resource strain: Not on file  . Food insecurity    Worry: Not on file    Inability: Not on file  . Transportation needs    Medical: Not on file    Non-medical: Not on file  Tobacco Use  . Smoking status: Never Smoker  . Smokeless tobacco: Never Used  Substance and Sexual Activity  . Alcohol use: Never    Frequency: Never  . Drug use: Never  . Sexual activity: Yes    Birth control/protection: None    Comment: Female Circ   Lifestyle  . Physical activity    Days per week: Not on file    Minutes per session: Not on  file  . Stress: Not on file  Relationships  . Social Musician on phone: Not on file    Gets together: Not on file    Attends religious service: Not on file    Active member of club or organization: Not on file    Attends meetings of clubs or organizations: Not on file    Relationship status: Not on file  Other Topics Concern  . Not on file  Social History Narrative   ** Merged History Encounter **        Family History: No family history on file.  Allergies: No Known Allergies  Medications Prior to Admission  Medication Sig Dispense Refill Last Dose  . Prenatal Vit-Fe Fumarate-FA (MULTIVITAMIN-PRENATAL) 27-0.8 MG TABS tablet Take 1 tablet by mouth daily at 12 noon.        Review of Systems   All systems reviewed and negative except as stated in HPI  Blood pressure 94/76, pulse 82, temperature 98.1 F (36.7 C), temperature source Oral, resp. rate 16, height 5\' 5"  (1.651 m), weight 74.8 kg,  last menstrual period 03/20/2018, SpO2 99 %, currently breastfeeding. General appearance: alert, cooperative and no distress Lungs: clear to auscultation bilaterally Heart: regular rate and rhythm Abdomen: soft, non-tender; bowel sounds normal Pelvic: n/a Extremities: Homans sign is negative, no sign of DVT DTR's +2 Presentation: cephalic Fetal monitoringBaseline: 130 bpm, Variability: Good {> 6 bpm), Accelerations: Reactive and Decelerations: Absent Uterine activityFrequency: Every 2-5 minutes Dilation: 6.5 Effacement (%): 90 Station: -1 Exam by:: Erasmo Score RNC   Prenatal labs: ABO, Rh: --/--/AB NEG, AB NEG Performed at [facility], [address], [place], [place] [address]  [phone]) Antibody: NEG (11/14 0959) Rubella:   RPR:    HBsAg:    HIV:    GBS:    See prenatal records for labs  Prenatal Transfer Tool  Maternal Diabetes: No Genetic Screening: Normal Maternal Ultrasounds/Referrals: Normal Fetal Ultrasounds or other Referrals:   None Maternal Substance Abuse:  No Significant Maternal Medications:  None Significant Maternal Lab Results: Group B Strep negative  Results for orders placed or performed during the hospital encounter of 12/18/18 (from the past 24 hour(s))  SARS Coronavirus 2 by RT PCR (hospital order, performed in [place] hospital lab) Nasopharyngeal Nasopharyngeal Swab   Collection Time: 12/18/18  9:59 AM   Specimen: Nasopharyngeal Swab  Result Value Ref Range   SARS Coronavirus 2 NEGATIVE NEGATIVE  CBC   Collection Time: 12/18/18  9:59 AM  Result Value Ref Range   WBC 8.2 4.0 - 10.5 K/uL   RBC 3.86 (L) 3.87 - 5.11 MIL/uL   Hemoglobin 11.2 (L) 12.0 - 15.0 g/dL   HCT 33.4 (L) 36.0 - 46.0 %   MCV 86.5 80.0 - 100.0 fL   MCH 29.0 26.0 - 34.0 pg   MCHC 33.5 30.0 - 36.0 g/dL   RDW 13.7 11.5 - 15.5 %   Platelets 376 150 - 400 K/uL   nRBC 0.0 0.0 - 0.2 %  Type and screen [place]   Collection Time: 12/18/18  9:59 AM  Result Value Ref Range   ABO/RH(D) AB NEG    Antibody Screen NEG    Sample Expiration      12/21/2018,2359 Performed at [facility], [place] [address] [place], [place] [address]   ABO/Rh   Collection Time: 12/18/18  9:59 AM  Result Value Ref Range   ABO/RH(D)      AB NEG Performed at [facility] [address], [place], [place] [address]     Patient Active Problem List   Diagnosis Date Noted  . Normal labor 12/18/2018  . Susceptible to varicella (non-immune), currently pregnant 07/28/2016  . 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 Language barrier 07/21/2016    Assessment/Plan:  Lalla Brothers is a 69 y.o. G3P2002 at [redacted]w[redacted]d here for spontaneous onset of labor  #Labor: Desires expectant management. Will recheck in 2-3 hours and AROM then #Pain: epidural #FWB: Cat 1 #ID:  GBS neg #MOF: Breast #MOC: IUD outpatient #Circ:  Taylor, CNM  12/18/2018, 11:41 AM

## 2018-12-18 NOTE — Progress Notes (Signed)
Labor Progress Note Jameca Elfadl Kayle Passarelli is a 28 y.o. G3P2002 at [redacted]w[redacted]d presented for spontaneous onset of labor  S:  Patient feeling increased pressure.   O:  BP 94/76   Pulse 82   Temp 98.1 F (36.7 C) (Oral)   Resp 16   Ht 5\' 5"  (1.651 m)   Wt 74.8 kg   LMP 03/20/2018 (Approximate)   SpO2 99%   BMI 27.46 kg/m   Fetal Tracing:  Baseline: 135 Variability: moderate Accels: 15x15 Decels: none  Toco: 2-4   CVE: Dilation: 8 Effacement (%): 90 Cervical Position: Middle Station: -1 Presentation: Vertex Exam by:: Len Blalock CNM   A&P: 28 y.o. G3P2002 [redacted]w[redacted]d spontaneous onset of labor #Labor: Progressing well. Discussed with patient risks and benefits of AROM for augmentation of labor. Patient agreeable to plan of care. AROM with large amount of clear fluid. Patient and FHR tolerated procedure well.  #Pain: epidural #FWB: Cat 1 #GBS negative   Wende Mott, CNM 12:31 PM

## 2018-12-18 NOTE — Discharge Summary (Signed)
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 yo G3P2002 at 350w0das admitted in Active Labor on 12/18/2018. Patient had an uncomplicated labor course as follows: AROM with delivery soon after Membrane Rupture Time/Date: 12:23 PM ,12/18/2018   Intrapartum Procedures: Episiotomy:                                          Lacerations:  2nd degree [3];Perineal [11]  Patient had a delivery of a Viable infant. 12/18/2018  Information for the patient's newborn:  KhTarsha, Blando0[[id number]]Delivery Method: Vaginal, Spontaneous(Filed from Delivery Summary)     Pateint had an uncomplicated postpartum course.  She is ambulating, tolerating a regular diet, passing flatus, and urinating well. Patient is discharged home in stable condition on 12/19/18.  Delivery time: 2:30 PM    Magnesium Sulfate received: No BMZ received: No Rhophylac:No MMR:No Transfusion:No  Physical exam  Vitals:   12/18/18 2220 12/19/18 0228 12/19/18 0610 12/19/18 1532  BP: (!) 102/50 (!) 98/38 110/71 (!) 90/49  Pulse: 68 62 (!) 59   Resp: 18 18 16 16   Temp: 98.2 F (36.8 C) 97.8 F (36.6 C) 98.2 F (36.8 C) 98.2 F (36.8 C)  TempSrc: Oral Oral Oral Oral  SpO2: 100% 100% 100% 100%  Weight:      Height:       General: alert, cooperative and no distress Lochia:  appropriate Uterine Fundus: firm Incision: N/A DVT Evaluation: No evidence of DVT seen on physical exam. Labs: Lab Results  Component Value Date   WBC 10.6 (H) 12/19/2018   HGB 10.5 (L) 12/19/2018   HCT 31.7 (L) 12/19/2018   MCV 86.8 12/19/2018   PLT 338 12/19/2018   CMP Latest Ref Rng & Units 12/05/2017  Glucose 70 - 99 mg/dL 106(H)  BUN 6 - 20 mg/dL 10  Creatinine 0.44 - 1.00 mg/dL 0.70  Sodium 135 - 145 mmol/L 135  Potassium 3.5 - 5.1 mmol/L 3.4(L)  Chloride 98 - 111 mmol/L 103  CO2 22 - 32 mmol/L 22  Calcium 8.9 - 10.3 mg/dL 9.2  Total Protein 6.5 - 8.1 g/dL 7.4  Total Bilirubin 0.3 - 1.2 mg/dL 0.5  Alkaline Phos 38 - 126 U/L 80  AST 15 - 41 U/L 22  ALT 0 - 44 U/L 18  Discharge instruction: per After Visit Summary and "Baby and Me Booklet".  After visit meds:  Allergies as of 12/19/2018   No Known Allergies     Medication List    TAKE these medications   ibuprofen 600 MG tablet Commonly known as: ADVIL Take 1 tablet (600 mg total) by mouth every 6 (six) hours.   multivitamin-prenatal 27-0.8 MG Tabs tablet Take 1 tablet by mouth daily at 12 noon.       Diet: routine diet  Activity: Advance as tolerated. Pelvic rest for 6 weeks.   Outpatient follow up:4 weeks Follow up Appt:No future appointments. Follow up Visit: Follow-up Information    Department, [facility] Follow up.   Why: In 4 weeks for a postpartum appointment  Contact information: [place] [address] [phone]            [facility] patient   Newborn Data: Live born female  Birth Weight:   APGAR: 29, 9  Newborn Delivery   Birth date/time: 12/18/2018 14:30:00 Delivery type: Vaginal, Spontaneous      Baby Feeding: Breast Disposition:home with mother  Wende Mott, Mallory Shirk 12/19/18 4:37 PM

## 2018-12-18 NOTE — Anesthesia Preprocedure Evaluation (Signed)
Anesthesia Evaluation  Patient identified by MRN, date of birth, ID band Patient awake    Reviewed: Allergy & Precautions, H&P , NPO status , Patient's Chart, lab work & pertinent test results  History of Anesthesia Complications Negative for: history of anesthetic complications  Airway Mallampati: II  TM Distance: >3 FB Neck ROM: full    Dental no notable dental hx. (+) Teeth Intact   Pulmonary neg pulmonary ROS,    Pulmonary exam normal breath sounds clear to auscultation       Cardiovascular negative cardio ROS Normal cardiovascular exam Rhythm:regular Rate:Normal     Neuro/Psych negative neurological ROS  negative psych ROS   GI/Hepatic negative GI ROS, Neg liver ROS,   Endo/Other  negative endocrine ROS  Renal/GU negative Renal ROS  negative genitourinary   Musculoskeletal   Abdominal   Peds  Hematology negative hematology ROS (+)   Anesthesia Other Findings Non english speaking  Reproductive/Obstetrics (+) Pregnancy                             Anesthesia Physical  Anesthesia Plan  ASA: II  Anesthesia Plan: Epidural   Post-op Pain Management:    Induction:   PONV Risk Score and Plan:   Airway Management Planned:   Additional Equipment:   Intra-op Plan:   Post-operative Plan:   Informed Consent: I have reviewed the patients History and Physical, chart, labs and discussed the procedure including the risks, benefits and alternatives for the proposed anesthesia with the patient or authorized representative who has indicated his/her understanding and acceptance.       Plan Discussed with:   Anesthesia Plan Comments:         Anesthesia Quick Evaluation

## 2018-12-18 NOTE — MAU Note (Signed)
Chloris Elfadl Shakeila Pfarr is a 28 y.o. at [redacted]w[redacted]d here in MAU reporting:  +contractions. 8 minutes apart Onset of complaint: 5am Pain score: 9/10. Desires epidural Denies LOF +bloody show Interpreter used #40005 stratus. Patient also desires husband to help interpret. Vitals:   12/18/18 0943  BP: 121/66  Pulse: (!) 108  Resp: 16  Temp: 98.1 F (36.7 C)  SpO2: 99%     FHT: 130's

## 2018-12-18 NOTE — Anesthesia Procedure Notes (Signed)
Epidural Patient location during procedure: OB Start time: 12/18/2018 10:50 AM End time: 12/18/2018 11:04 AM  Staffing Anesthesiologist: Lynda Rainwater, MD Performed: anesthesiologist   Preanesthetic Checklist Completed: patient identified, site marked, surgical consent, pre-op evaluation, timeout performed, IV checked, risks and benefits discussed and monitors and equipment checked  Epidural Patient position: sitting Prep: ChloraPrep Patient monitoring: heart rate, cardiac monitor, continuous pulse ox and blood pressure Approach: midline Location: L2-L3 Injection technique: LOR saline  Needle:  Needle type: Tuohy  Needle gauge: 17 G Needle length: 9 cm Needle insertion depth: 5 cm Catheter type: closed end flexible Catheter size: 20 Guage Catheter at skin depth: 9 cm Test dose: negative  Assessment Events: blood not aspirated, injection not painful, no injection resistance, negative IV test and no paresthesia  Additional Notes Reason for block:procedure for pain

## 2018-12-19 ENCOUNTER — Encounter (HOSPITAL_COMMUNITY): Payer: Self-pay | Admitting: *Deleted

## 2018-12-19 LAB — CBC
HCT: 31.7 % — ABNORMAL LOW (ref 36.0–46.0)
Hemoglobin: 10.5 g/dL — ABNORMAL LOW (ref 12.0–15.0)
MCH: 28.8 pg (ref 26.0–34.0)
MCHC: 33.1 g/dL (ref 30.0–36.0)
MCV: 86.8 fL (ref 80.0–100.0)
Platelets: 338 10*3/uL (ref 150–400)
RBC: 3.65 MIL/uL — ABNORMAL LOW (ref 3.87–5.11)
RDW: 13.7 % (ref 11.5–15.5)
WBC: 10.6 10*3/uL — ABNORMAL HIGH (ref 4.0–10.5)
nRBC: 0 % (ref 0.0–0.2)

## 2018-12-19 LAB — RPR: RPR Ser Ql: NONREACTIVE

## 2018-12-19 MED ORDER — IBUPROFEN 600 MG PO TABS: 600.0000 mg | ORAL_TABLET | Freq: Four times a day (QID) | ORAL | 0 refills | Status: DC

## 2018-12-19 MED ORDER — RHO D IMMUNE GLOBULIN 1500 UNIT/2ML IJ SOSY
300.0000 ug | PREFILLED_SYRINGE | Freq: Once | INTRAMUSCULAR | Status: AC
  Administered 2018-12-19: 300 ug via INTRAVENOUS
  Filled 2018-12-19: qty 2

## 2018-12-19 NOTE — Anesthesia Postprocedure Evaluation (Signed)
Anesthesia Post Note  Patient: Madison Bishop  Procedure(s) Performed: AN AD [place]     Patient location during evaluation: Mother Baby Anesthesia Type: Epidural Level of consciousness: awake Pain management: satisfactory to patient Vital Signs Assessment: post-procedure vital signs reviewed and stable Respiratory status: spontaneous breathing Cardiovascular status: stable Anesthetic complications: no    Last Vitals:  Vitals:   12/19/18 0228 12/19/18 0610  BP: (!) 98/38 110/71  Pulse: 62 (!) 59  Resp: 18 16  Temp: 36.6 C 36.8 C  SpO2: 100% 100%    Last Pain:  Vitals:   12/19/18 0610  TempSrc: Oral  PainSc: 3    Pain Goal: Patients Stated Pain Goal: 2 (12/19/18 0610)  Report via [facility] RN                 Casimer Lanius

## 2018-12-19 NOTE — Lactation Note (Signed)
This note was copied from a baby's chart. Lactation Consultation Note  Patient Name: Madison Bishop [id number] Date: 12/19/2018 Reason for consult: Follow-up assessment;Term  1650 - 1700 - I followed up with Madison Bishop to conduct a discharge consult. Ipad interpretor, Madison Bishop [phone].  Madison Bishop was breast feeding baby "Madison Bishop" upon entry. She was holding her in cradle hold while sitting up at the side of the bed, and baby was on the right breast. I noted rhythmic suckling sequences.   I conducted discharge education including discussion of day 2 infant feeding patterns, when to expect mature milk to transition, output expectations, and how to manage engorgement. Madison Bishop indicated that she had our breast feeding brochure with our lactation contact information.  Madison Bishop plans to use [facility] for follow up and will call tomorrow to set up an appointment. I recommended that she see her pediatrician within 48 hours on discharge.  No further questions at this time.   Feeding Feeding Type: Breast Fed  LATCH Score Latch: Grasps breast easily, tongue down, lips flanged, rhythmical sucking.  Audible Swallowing: A few with stimulation  Type of Nipple: Everted at rest and after stimulation  Comfort (Breast/Nipple): Soft / non-tender  Hold (Positioning): No assistance needed to correctly position infant at breast.  LATCH Score: 9  Interventions Interventions: Breast feeding basics reviewed  Lactation Tools Discussed/Used     Consult Status Consult Status: Complete    Lenore Manner 12/19/2018, 5:13 PM

## 2018-12-19 NOTE — Progress Notes (Signed)
POSTPARTUM PROGRESS NOTE  Post Partum Day 1  Subjective:  Madison Bishop is a 28 y.o. [id number] s/p SVD at [redacted]w[redacted]d.  She reports she is doing well. No acute events overnight. She denies any problems with ambulating, voiding or po intake. Denies nausea or vomiting.  Pain is well controlled.  Lochia is appropriate.  Objective: Blood pressure 110/71, pulse (!) 59, temperature 98.2 F (36.8 C), temperature source Oral, resp. rate 16, height 5\' 5"  (1.651 m), weight 74.8 kg, last menstrual period 03/20/2018, SpO2 100 %, currently breastfeeding.  Physical Exam:  General: alert, cooperative and no distress Chest: no respiratory distress Heart:regular rate, distal pulses intact Abdomen: soft, nontender,  Uterine Fundus: firm, appropriately tender DVT Evaluation: No calf swelling or tenderness Extremities: No LE edema Skin: warm, dry  Recent Labs    12/18/18 0959 12/19/18 0407  HGB 11.2* 10.5*  HCT 33.4* 31.7*    Assessment/Plan: Madison Bishop is a 28 y.o. [id number] s/p SVD at [redacted]w[redacted]d   PPD#1 - Doing well  Routine postpartum care Contraception: IUD Feeding: Breast Dispo: Plan for discharge PPD#2. Could go home today if baby discharged and mom would like to go home.    LOS: 1 day   Phill Myron, D.O. OB Fellow  12/19/2018, 8:42 AM

## 2018-12-19 NOTE — Discharge Instructions (Signed)

## 2018-12-19 NOTE — Lactation Note (Signed)
This note was copied from a baby's chart. Lactation Consultation Note  Patient Name: Madison Bishop [id number] Date: 12/19/2018   Video interpreter used for Arabic. Ex BF  For 1.5 years.  P3, Baby 81 hours old.  Mother states baby recently breastfed.  Mother demonstrated hand expression with good flow. Mother attempted to latch baby but baby is sleepy and fell asleep after a few sucks. Feed on demand with cues.  Goal 8-12+ times per day after first 24 hrs.  Place baby STS if not cueing.  Mom made aware of O/P services, breastfeeding support groups, community resources, and our phone # for post-discharge questions.       Maternal Data    Feeding Feeding Type: Breast Fed  LATCH Score Latch: Grasps breast easily, tongue down, lips flanged, rhythmical sucking.  Audible Swallowing: A few with stimulation  Type of Nipple: Everted at rest and after stimulation  Comfort (Breast/Nipple): Soft / non-tender  Hold (Positioning): Assistance needed to correctly position infant at breast and maintain latch.  LATCH Score: 8  Interventions    Lactation Tools Discussed/Used     Consult Status      Vivianne Master [facility] 12/19/2018, 12:16 PM

## 2018-12-19 NOTE — Lactation Note (Signed)
This note was copied from a baby's chart. Lactation Consultation Note  Patient Name: Madison Bishop Date: 12/19/2018  P2, 32 hour female infant. LC entered room mom and infant asleep at this time.    Maternal Data    Feeding Feeding Type: Breast Fed  LATCH Score Latch: Grasps breast easily, tongue down, lips flanged, rhythmical sucking.  Audible Swallowing: A few with stimulation  Type of Nipple: Everted at rest and after stimulation  Comfort (Breast/Nipple): Soft / non-tender  Hold (Positioning): No assistance needed to correctly position infant at breast.  LATCH Score: 9  Interventions    Lactation Tools Discussed/Used     Consult Status      Vicente Serene 12/19/2018, 1:57 AM

## 2018-12-20 ENCOUNTER — Encounter (HOSPITAL_COMMUNITY): Payer: Self-pay | Admitting: *Deleted

## 2018-12-20 LAB — RH IG WORKUP (INCLUDES ABO/RH)
ABO/RH(D): AB NEG
Fetal Screen: NEGATIVE
Gestational Age(Wks): 39.1
Unit division: 0

## 2019-12-29 IMAGING — US US MFM FETAL NUCHAL TRANSLUCENCY
1 series · 14 of 28 positions shown · non-contrast
Comparison: none

[Series 1: us mfm fetal nuchal translucency · 14 of 36 slices shown]
[im 2/36]
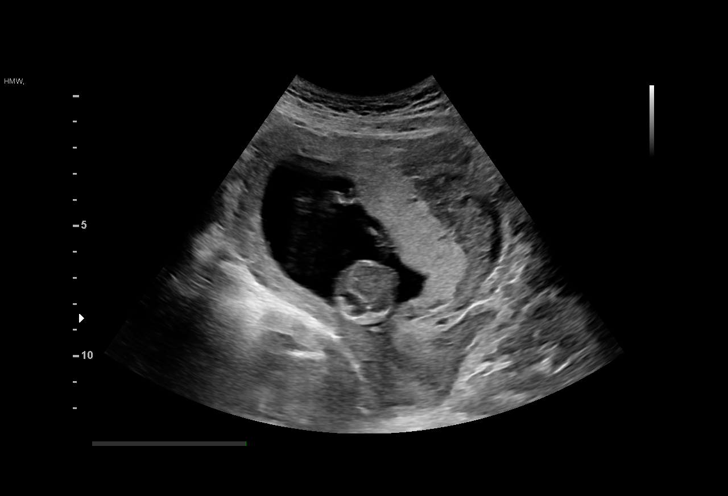
[im 4/36]
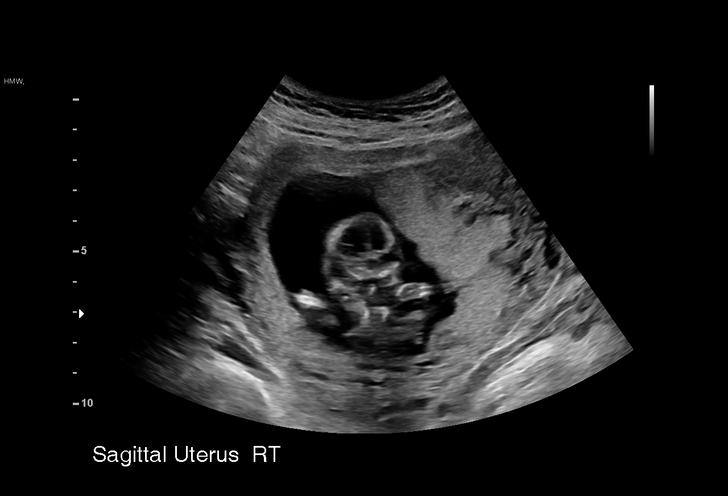
[im 7/36]
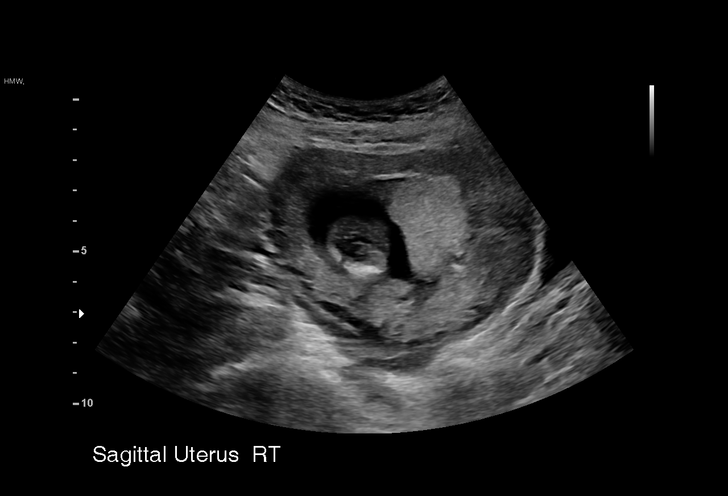
[im 10/36]
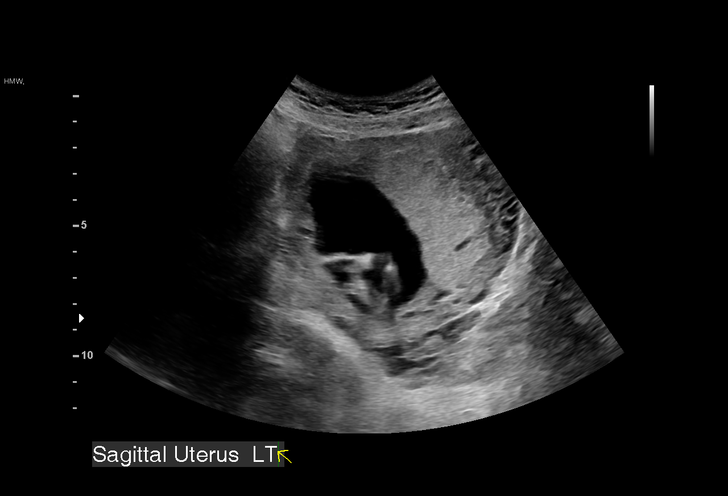
[im 12/36]
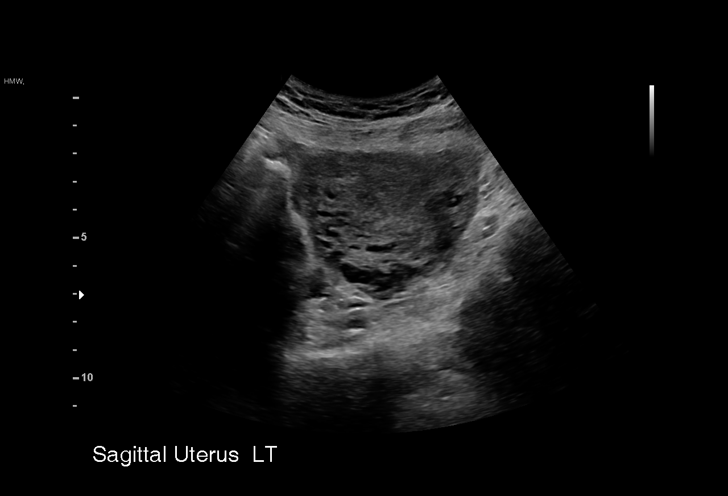
[im 15/36]
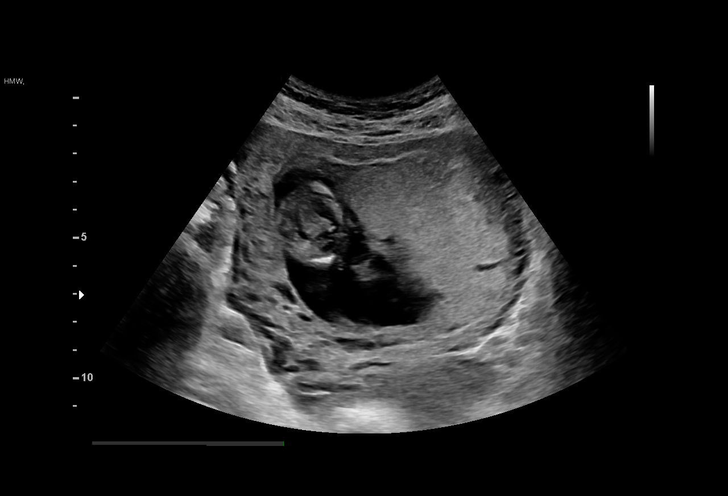
[im 17/36]
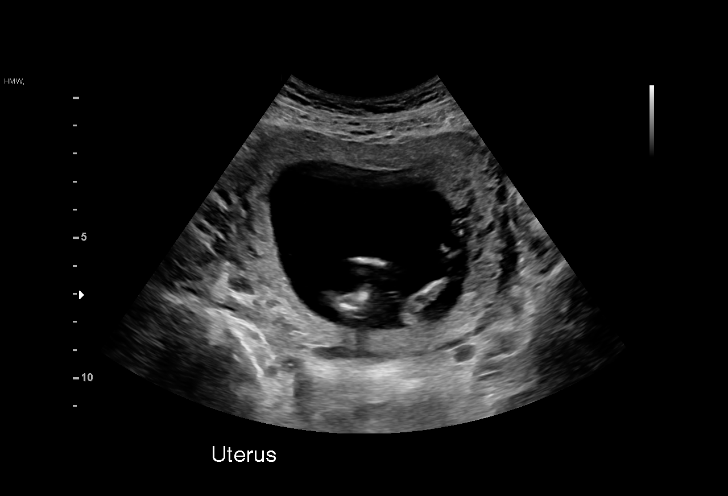
[im 20/36]
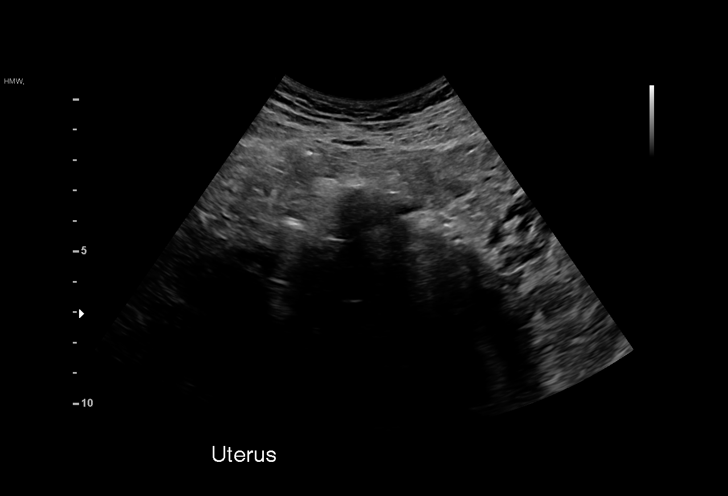
[im 23/36]
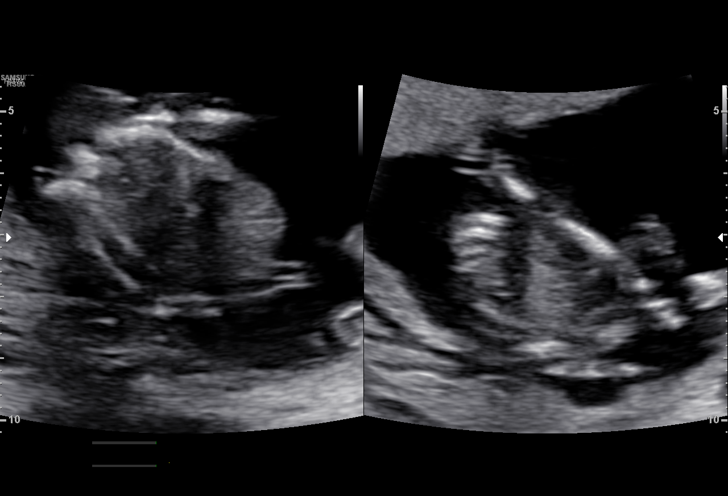
[im 25/36]
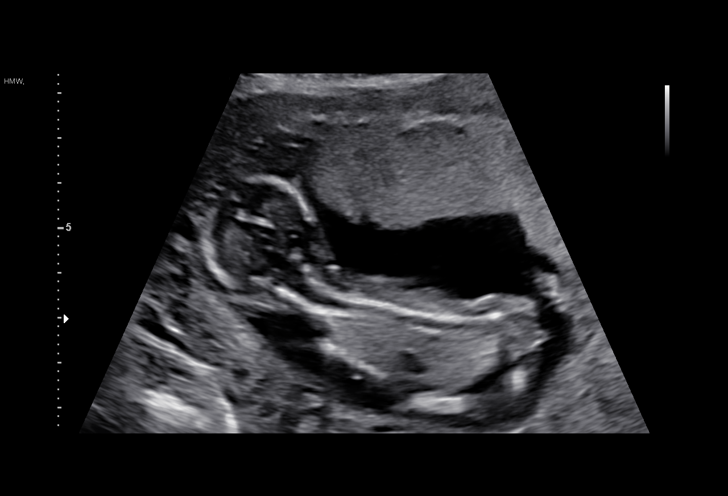
[im 28/36]
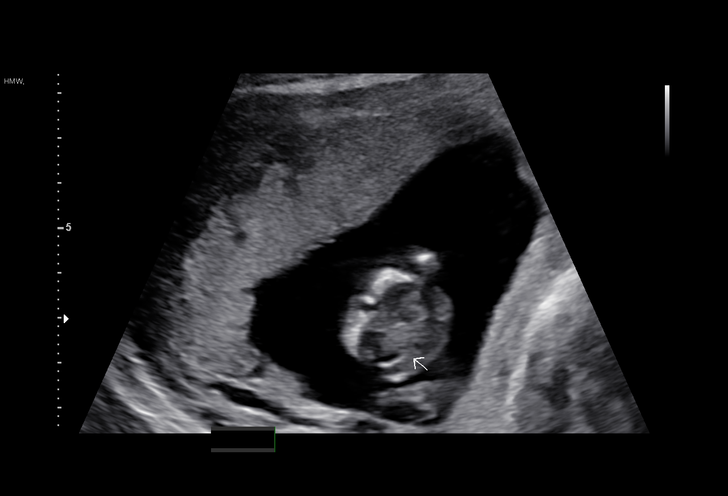
[im 30/36]
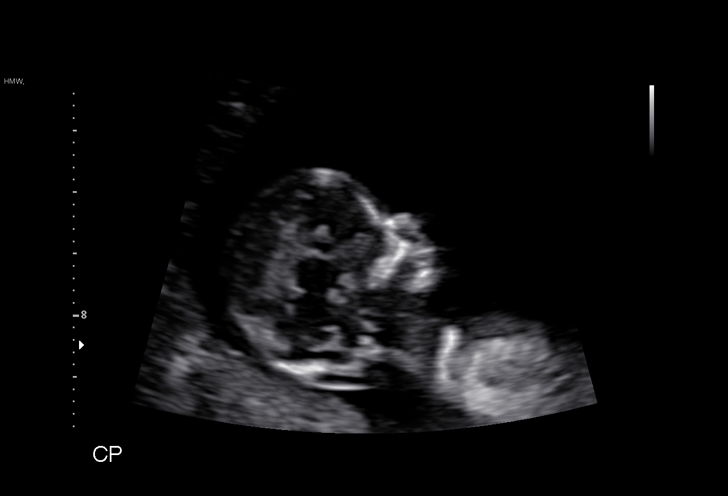
[im 33/36]
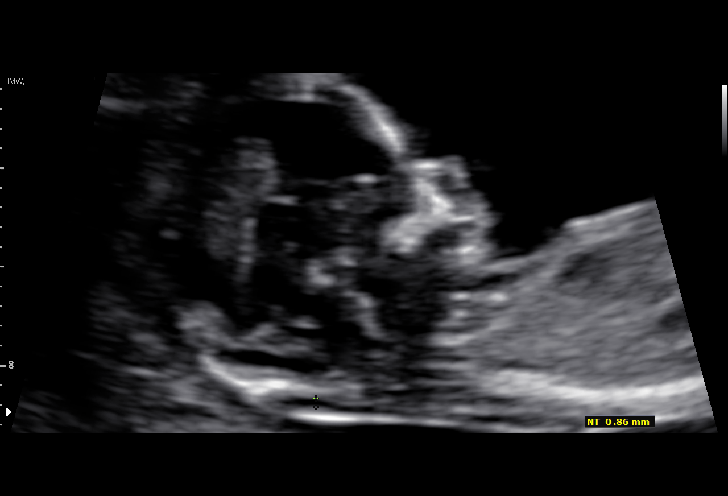
[im 36/36]
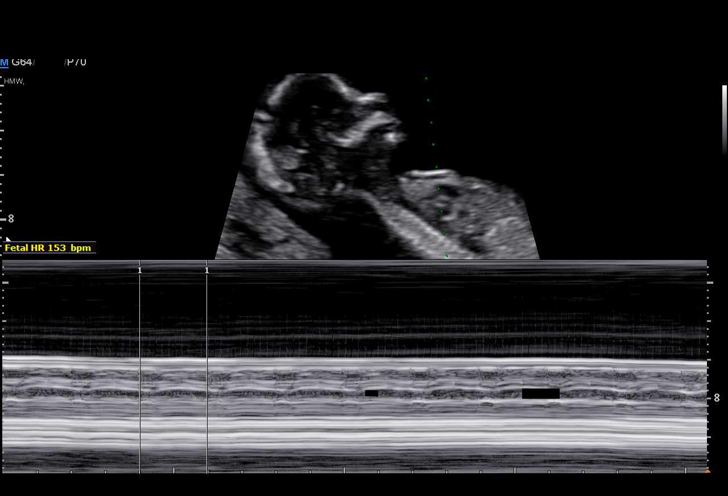

[14 of 28 positions shown; findings below may reference images not displayed]

[REDACTED]-
                   Faculty Physician

     TRANSLUCENCY                                      ODD MORTEN
 ----------------------------------------------------------------------

 ----------------------------------------------------------------------
Indications

  13 weeks gestation of pregnancy
  Encounter for uncertain dates
  Encounter for nuchal translucency
 ----------------------------------------------------------------------
Fetal Evaluation

 Num Of Fetuses:         1
 Preg. Location:         Intrauterine
 Gest. Sac:              Intrauterine
 Yolk Sac:               Not visualized
 Fetal Pole:             Visualized
 Fetal Heart Rate(bpm):  153
 Cardiac Activity:       Observed

 Amniotic Fluid
 AFI FV:      Within normal limits
Biometry

 CRL:      76.4  mm     G. Age:  13w 3d                  EDD:   12/27/18
 NT:        1.5  mm
OB History

 Gravidity:    5         Term:   4        Prem:   0        SAB:   0
 TOP:          0       Ectopic:  0        Living: 4
Gestational Age
 LMP:           13w 5d        Date:  03/20/18                 EDD:   12/25/18
 Best:          13w 3d     Det. By:  U/S C R L (06/24/18)     EDD:   12/27/18
Anatomy

 Choroid Plexus:        Appears normal         Bladder:                Appears normal
 Thoracic:              Appears normal         Upper Extremities:      Visualized
 Stomach:               Appears normal, left   Lower Extremities:      Visualized
                        sided
Cervix Uterus Adnexa

 Cervix
 Normal appearance by transabdominal scan.

 Uterus
 No abnormality visualized.

 Left Ovary
 No adnexal mass visualized.

 Right Ovary
 No adnexal mass visualized.

 Cul De Sac
 No free fluid seen.

 Adnexa
 No abnormality visualized.
Impression

 On ultrasound, the CRL measurement is consistent with 13w
 3d gestation and good fetal heart activity is seen. The nuchal
 translucency (NT) measures 1.5 millimeters, which is normal.
 Fetal anatomy that could be ascertained at this gestational
 age is normal.
 We assigned her EDD at 12/27/2018.

 Blood was drawn for LARGO and beta hCG.  We will
 communicate the results to the patient and fax a copy to your
 office.
Recommendations

 Fetal anatomy scan at 20 weeks.
                 [place], Yves

## 2020-02-03 IMAGING — CR CHEST  1 VIEW
1 series · 1 of 1 positions shown · non-contrast
Comparison: December 15, 2017

CLINICAL DATA: Positive tuberculin skin test

EXAM:
CHEST  1 VIEW

[w chest pa]
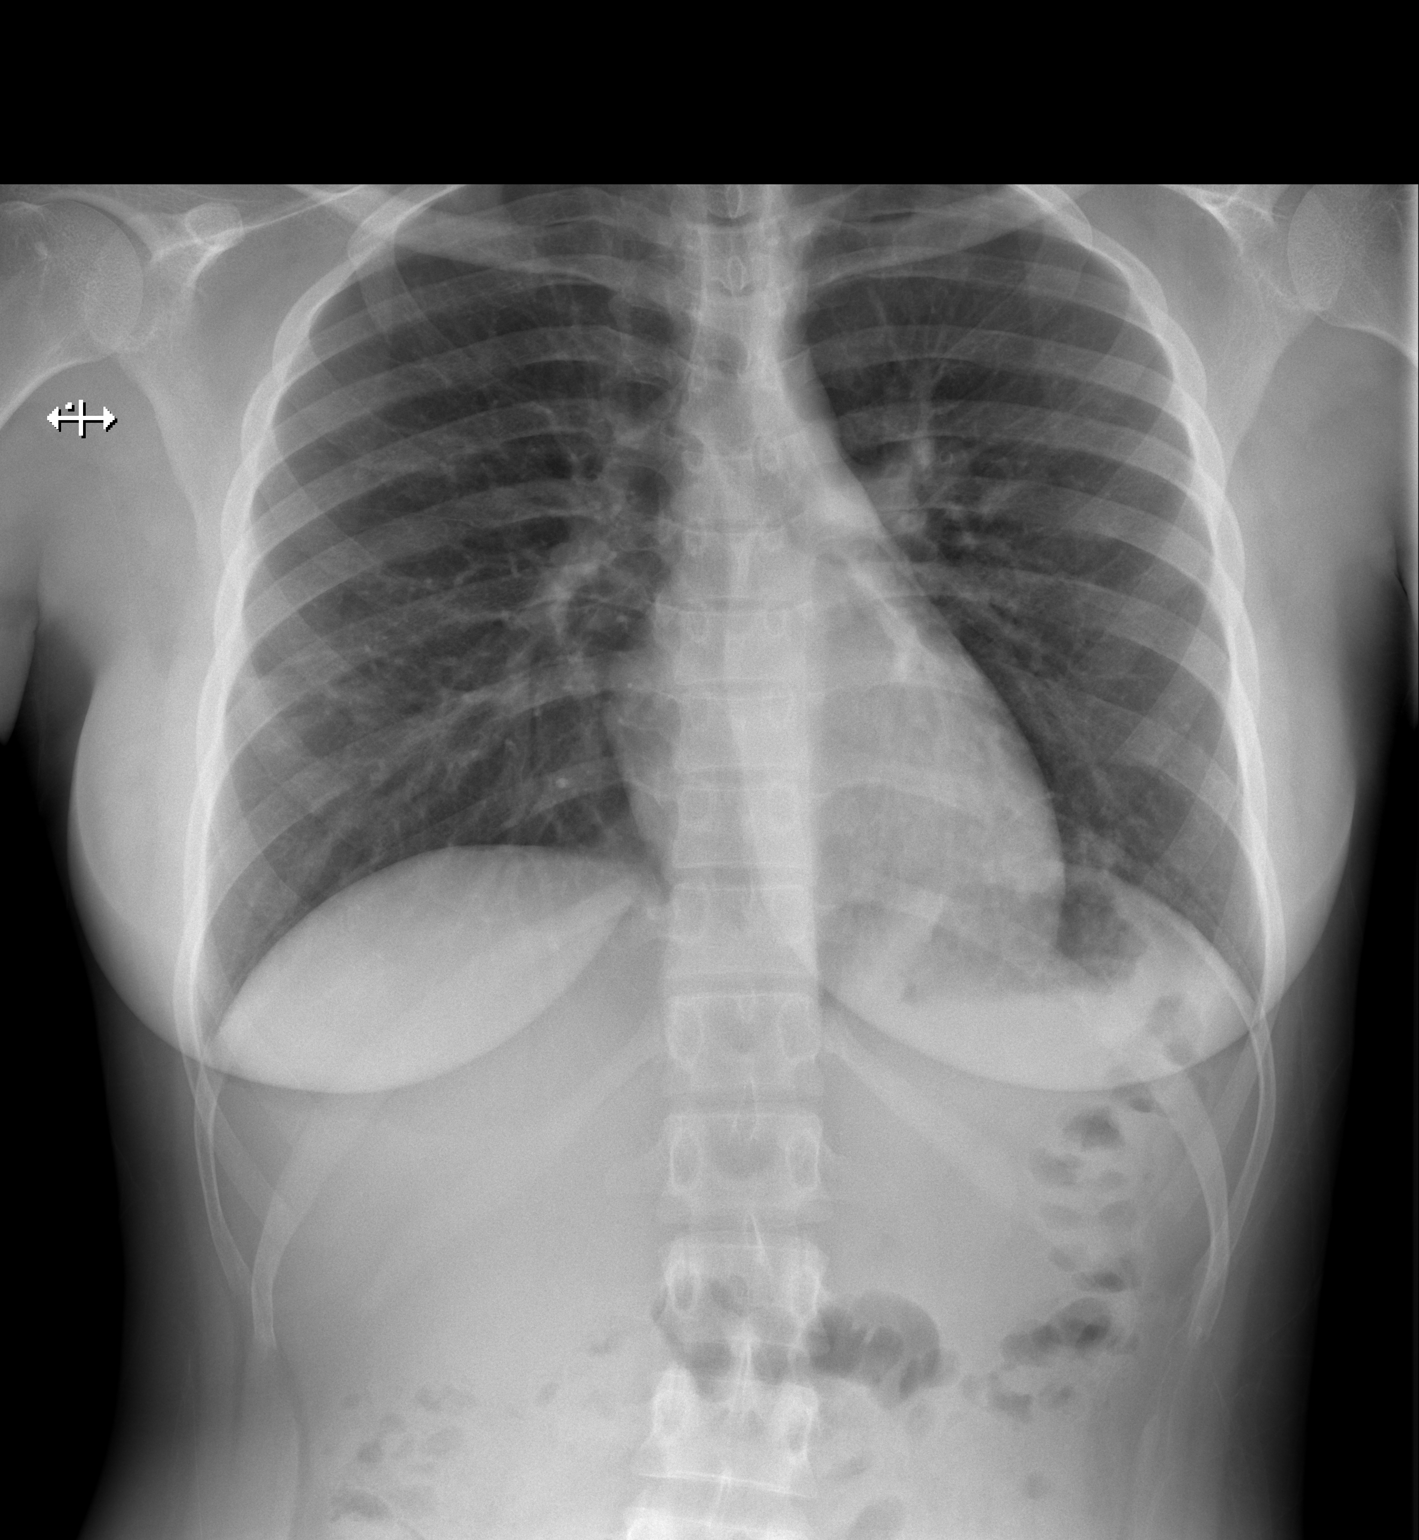

[1 of 1 positions shown; findings below may reference images not displayed]

FINDINGS: No edema or consolidation. Heart size and pulmonary vascularity are
normal. No adenopathy. No bone lesions.
IMPRESSION: No edema or consolidation.  No evident adenopathy.

## 2020-02-04 NOTE — L&D Delivery Note (Signed)
OB/GYN Faculty Practice Delivery Note  Madison Bishop is a 30 y.o. [phone] s/p vag del at [redacted]w[redacted]d. She was admitted for IOL due to FGR.   ROM: 2h 67m with clear fluid GBS Status: neg Maximum Maternal Temperature: 98.7  Labor Progress: [name] was induced due to FGR (EFW 8th%); she had the usual cx ripening methods and then progressed to complete not long after AROM.  Delivery Date/Time: October 26, 2020 at 2050 Delivery: Called to room and patient was complete and pushing. Head delivered direct OA after deinfibulation performed with approx a 2cm cut. No nuchal cord present. Shoulder and body delivered in usual fashion. Infant with spontaneous cry, placed on mother's abdomen, dried and stimulated. Cord clamped x 2 after 1-minute delay, and cut by CNM. Cord blood drawn. Placenta delivered spontaneously with gentle cord traction. Fundus firm with massage and Pitocin. Labia, perineum, vagina, and cervix inspected and found to have no additional lacs.   Placenta: spont, intact; to L&D Complications: none Deinfibuilation repaired with 4-0 monocryl with 3 interrupted sutures EBL: 100cc Analgesia: epidural  Postpartum Planning [ ]  pt to schedule [facility] at [facility] [ ]  will have Rhogam eval  Infant: girl  APGARs 9/9  2849g (6lb 4.5oz)  , CNM  10/26/2020 10:15 PM

## 2020-05-07 LAB — OB RESULTS CONSOLE HIV ANTIBODY (ROUTINE TESTING): HIV: NONREACTIVE

## 2020-05-07 LAB — OB RESULTS CONSOLE RPR: RPR: NONREACTIVE

## 2020-05-07 LAB — OB RESULTS CONSOLE HEPATITIS B SURFACE ANTIGEN: Hepatitis B Surface Ag: NEGATIVE

## 2020-05-07 LAB — OB RESULTS CONSOLE RUBELLA ANTIBODY, IGM: Rubella: IMMUNE

## 2020-05-07 LAB — OB RESULTS CONSOLE VARICELLA ZOSTER ANTIBODY, IGG: Varicella: NON-IMMUNE/NOT IMMUNE

## 2020-05-07 LAB — HEPATITIS C ANTIBODY: HCV Ab: NEGATIVE

## 2020-06-01 ENCOUNTER — Ambulatory Visit
Admission: RE | Admit: 2020-06-01 | Discharge: 2020-06-01 | Disposition: A | Discharge status: Home / Self Care | Payer: No Typology Code available for payment source | Source: Ambulatory Visit | Attending: Obstetrics and Gynecology | Admitting: Obstetrics and Gynecology

## 2020-06-01 ENCOUNTER — Other Ambulatory Visit: Payer: Self-pay | Admitting: Obstetrics and Gynecology

## 2020-06-01 ENCOUNTER — Other Ambulatory Visit: Payer: Self-pay

## 2020-06-01 DIAGNOSIS — Z111 Encounter for screening for respiratory tuberculosis: Secondary | ICD-10-CM

## 2020-06-27 ENCOUNTER — Other Ambulatory Visit: Payer: Self-pay | Admitting: Obstetrics and Gynecology

## 2020-06-27 DIAGNOSIS — O365920 Maternal care for other known or suspected poor fetal growth, second trimester, not applicable or unspecified: Secondary | ICD-10-CM

## 2020-07-18 ENCOUNTER — Ambulatory Visit: Payer: No Typology Code available for payment source

## 2020-09-20 ENCOUNTER — Encounter: Payer: Self-pay | Admitting: *Deleted

## 2020-09-24 ENCOUNTER — Ambulatory Visit: Payer: No Typology Code available for payment source | Attending: Obstetrics and Gynecology

## 2020-09-24 ENCOUNTER — Ambulatory Visit: Payer: No Typology Code available for payment source

## 2020-10-19 LAB — OB RESULTS CONSOLE RPR: RPR: NONREACTIVE

## 2020-10-19 LAB — OB RESULTS CONSOLE GC/CHLAMYDIA
Chlamydia: NEGATIVE
Gonorrhea: NEGATIVE

## 2020-10-19 LAB — OB RESULTS CONSOLE HIV ANTIBODY (ROUTINE TESTING): HIV: NONREACTIVE

## 2020-10-19 LAB — OB RESULTS CONSOLE GBS: GBS: NEGATIVE

## 2020-10-25 NOTE — H&P (Addendum)
OBSTETRIC ADMISSION HISTORY AND PHYSICAL  Madison Bishop is a 30 y.o. female [phone] with IUP at [redacted]w[redacted]d by [place] presenting for IOL for IUGR. She reports +FMs, no LOF, no VB, no blurry vision, headaches, peripheral edema, or RUQ pain.  She plans on breast and formula feeding. She requests Nexplanon outpatient for birth control.  She received her prenatal care at [facility].   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 [place], Madison Bishop called him yesterday with patient's ultrasound findings and he recommend IOL today.   I called over to Madison Bishop and results are as follows: 2704gm, efw 8.8%, AC c/w 35/1wks, AFI 13.3, cephalic, bpp 8/8, posterior placenta   [facility] MD Attending Bishop for Madison Bishop (Faculty Practice) 10/26/2020 Time: 1215pm  Past Medical History: Past Medical History:  Diagnosis Date   Low maternal weight gain 01/08/2017   Medical history non-contributory    Normal labor 01/21/2017   Pneumonia    Rh negative, antepartum 07/21/2016   Rhogam at 28 weeks   Seasonal allergies    SVD (spontaneous vaginal delivery) 01/22/2017    Past Surgical History: Past Surgical History:  Procedure Laterality Date   NO PAST SURGERIES      Obstetrical History: OB History     Gravida  4   Para  3   Term  3   Preterm  0   AB  0   Living  3      SAB  0   IAB  0   Ectopic  0   Multiple      Live Births  3           Social History Social History   Socioeconomic History   Marital status: Unknown    Spouse name: Not on file   Number of children: Not on file   Years of  education: Not on file   Highest education level: Not on file  Occupational History   Not on file  Tobacco Use   Smoking status: Never   Smokeless tobacco: Never  Vaping Use   Vaping Use: Never used  Substance and Sexual Activity   Alcohol use: Never   Drug use: Never   Sexual activity: Yes    Birth control/protection: None    Comment: Female Circ   Other Topics Concern   Not on file  Social History Narrative   ** Merged History Encounter **       Social Determinants of Health   Financial Resource Strain: Not on file  Food Insecurity: Not on file  Transportation Needs: Not on file  Physical Activity: Not on file  Stress: Not on file  Social Connections: Not on file    Family History: No family history on file.  Allergies: No Known Allergies  No medications prior to admission.     Review of Systems  All systems reviewed and negative except as stated in HPI  Last menstrual period 02/06/2020, unknown if currently breastfeeding.  General appearance: alert, cooperative, and no distress Lungs: clear to auscultation bilaterally Heart: regular rate and rhythm Abdomen: soft, non-tender; bowel sounds normal Pelvic: 1/50/-2 Extremities: 01[birth year]1997  sign is negative, no sign of DVT DTR's 2+ Presentation: cephalic Fetal monitoringBaseline: 140 bpm, Variability: Good {> 6 bpm), Accelerations: Reactive, and Decelerations: Absent Uterine activityNone    FB placement Procedure: Patient informed of R/B/A of procedure. NST was performed and was reactive prior to procedure.  Procedure done to begin ripening of the cervix prior to admission for induction of labor. Appropriate time out taken. The patient was placed in the lithotomy position and a cervical exam was performed and a finger was used to guide the 62F foley through the internal os of the cervix. Foley Balloon filled with 60cc of sterile water. Plug inserted into end of the foley. Foley placed on tension and taped to  medial thigh.      Prenatal labs: ABO, Rh:  AB neg  Antibody:  Neg Rubella:  Immune  RPR:   Non-Reactive HBsAg:   Negative HIV:   Negative GBS:   Negative 1 hr Glucola 87 Genetic screening  Screen Negative  Anatomy US normal, IUGR 6%tile   Prenatal Transfer Tool  Maternal Diabetes: No Genetic Screening: Normal Maternal Ultrasounds/Referrals: IUGR Fetal Ultrasounds or other Referrals:  Referred to Materal Fetal Medicine ; appears patient did not make it to appointment Maternal Substance Abuse:  No Significant Maternal Medications:  None Significant Maternal Lab Results: Group B Strep negative and Rh negative  No results found for this or any previous visit (from the past 24 hour(s)).  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 G4P3003 at [redacted]w[redacted]d here for IOL 2/2 IUGR  #Labor:Induction, FB and cytotec to start, pitocon PRN #Pain: PRN #FWB: Cat 1 #ID:  GBS neg  #MOF: Breast/formula  #MOC:Nexplanon  #Circ:  N/A   Thressa Sheller DNP, CNM  10/26/20  1:26 PM

## 2020-10-26 ENCOUNTER — Inpatient Hospital Stay (HOSPITAL_COMMUNITY)
Admission: AD | Admit: 2020-10-26 | Discharge: 2020-10-28 | DRG: 807 | Disposition: A | Discharge status: Home / Self Care | Payer: Medicaid Other | Attending: Family Medicine | Admitting: Family Medicine

## 2020-10-26 ENCOUNTER — Inpatient Hospital Stay (HOSPITAL_COMMUNITY): Payer: Medicaid Other | Admitting: Anesthesiology

## 2020-10-26 ENCOUNTER — Other Ambulatory Visit: Payer: Self-pay

## 2020-10-26 ENCOUNTER — Encounter (HOSPITAL_COMMUNITY): Payer: Self-pay | Admitting: Obstetrics and Gynecology

## 2020-10-26 ENCOUNTER — Inpatient Hospital Stay (HOSPITAL_COMMUNITY): Payer: Medicaid Other

## 2020-10-26 DIAGNOSIS — O26893 Other specified pregnancy related conditions, third trimester: Secondary | ICD-10-CM | POA: Diagnosis present

## 2020-10-26 DIAGNOSIS — Z30017 Encounter for initial prescription of implantable subdermal contraceptive: Secondary | ICD-10-CM

## 2020-10-26 DIAGNOSIS — O365990 Maternal care for other known or suspected poor fetal growth, unspecified trimester, not applicable or unspecified: Secondary | ICD-10-CM | POA: Diagnosis present

## 2020-10-26 DIAGNOSIS — Z227 Latent tuberculosis: Secondary | ICD-10-CM | POA: Diagnosis not present

## 2020-10-26 DIAGNOSIS — O365930 Maternal care for other known or suspected poor fetal growth, third trimester, not applicable or unspecified: Secondary | ICD-10-CM | POA: Diagnosis present

## 2020-10-26 DIAGNOSIS — O0930 Supervision of pregnancy with insufficient antenatal care, unspecified trimester: Secondary | ICD-10-CM

## 2020-10-26 DIAGNOSIS — N90810 Female genital mutilation status, unspecified: Secondary | ICD-10-CM | POA: Diagnosis present

## 2020-10-26 DIAGNOSIS — Z789 Other specified health status: Secondary | ICD-10-CM | POA: Diagnosis present

## 2020-10-26 DIAGNOSIS — O26899 Other specified pregnancy related conditions, unspecified trimester: Secondary | ICD-10-CM

## 2020-10-26 DIAGNOSIS — Z20822 Contact with and (suspected) exposure to covid-19: Secondary | ICD-10-CM | POA: Diagnosis present

## 2020-10-26 DIAGNOSIS — Z6791 Unspecified blood type, Rh negative: Principal | ICD-10-CM

## 2020-10-26 DIAGNOSIS — Z3A38 38 weeks gestation of pregnancy: Secondary | ICD-10-CM | POA: Diagnosis not present

## 2020-10-26 LAB — CBC
HCT: 32.7 % — ABNORMAL LOW (ref 36.0–46.0)
Hemoglobin: 11.1 g/dL — ABNORMAL LOW (ref 12.0–15.0)
MCH: 28.8 pg (ref 26.0–34.0)
MCHC: 33.9 g/dL (ref 30.0–36.0)
MCV: 84.7 fL (ref 80.0–100.0)
Platelets: 358 10*3/uL (ref 150–400)
RBC: 3.86 MIL/uL — ABNORMAL LOW (ref 3.87–5.11)
RDW: 12.7 % (ref 11.5–15.5)
WBC: 8.3 10*3/uL (ref 4.0–10.5)
nRBC: 0 % (ref 0.0–0.2)

## 2020-10-26 LAB — RESP PANEL BY RT-PCR (FLU A&B, COVID) ARPGX2
Influenza A by PCR: NEGATIVE
Influenza B by PCR: NEGATIVE
SARS Coronavirus 2 by RT PCR: NEGATIVE

## 2020-10-26 LAB — TYPE AND SCREEN
ABO/RH(D): AB NEG
Antibody Screen: POSITIVE

## 2020-10-26 MED ORDER — ACETAMINOPHEN 325 MG PO TABS: 650.0000 mg | ORAL_TABLET | ORAL | Status: DC | PRN

## 2020-10-26 MED ORDER — EPHEDRINE 5 MG/ML INJ: 10.0000 mg | INTRAVENOUS | Status: DC | PRN

## 2020-10-26 MED ORDER — WITCH HAZEL-GLYCERIN EX PADS: 1.0000 "application " | MEDICATED_PAD | CUTANEOUS | Status: DC | PRN

## 2020-10-26 MED ORDER — PHENYLEPHRINE 40 MCG/ML (10ML) SYRINGE FOR IV PUSH (FOR BLOOD PRESSURE SUPPORT): 80.0000 ug | PREFILLED_SYRINGE | INTRAVENOUS | Status: DC | PRN

## 2020-10-26 MED ORDER — ONDANSETRON HCL 4 MG/2ML IJ SOLN: 4.0000 mg | Freq: Four times a day (QID) | INTRAMUSCULAR | Status: DC | PRN

## 2020-10-26 MED ORDER — LACTATED RINGERS IV SOLN: INTRAVENOUS | Status: DC

## 2020-10-26 MED ORDER — DIPHENHYDRAMINE HCL 25 MG PO CAPS: 25.0000 mg | ORAL_CAPSULE | Freq: Four times a day (QID) | ORAL | Status: DC | PRN

## 2020-10-26 MED ORDER — TERBUTALINE SULFATE 1 MG/ML IJ SOLN
0.2500 mg | Freq: Once | INTRAMUSCULAR | Status: AC
  Administered 2020-10-26: 0.25 mg via SUBCUTANEOUS

## 2020-10-26 MED ORDER — ACETAMINOPHEN 325 MG PO TABS
650.0000 mg | ORAL_TABLET | ORAL | Status: DC | PRN
  Administered 2020-10-27: 650 mg via ORAL
  Filled 2020-10-26: qty 2

## 2020-10-26 MED ORDER — MEASLES, MUMPS & RUBELLA VAC IJ SOLR: 0.5000 mL | Freq: Once | INTRAMUSCULAR | Status: DC

## 2020-10-26 MED ORDER — OXYCODONE-ACETAMINOPHEN 5-325 MG PO TABS: 2.0000 | ORAL_TABLET | ORAL | Status: DC | PRN

## 2020-10-26 MED ORDER — DIBUCAINE (PERIANAL) 1 % EX OINT: 1.0000 "application " | TOPICAL_OINTMENT | CUTANEOUS | Status: DC | PRN

## 2020-10-26 MED ORDER — TERBUTALINE SULFATE 1 MG/ML IJ SOLN
INTRAMUSCULAR | Status: AC
  Filled 2020-10-26: qty 1

## 2020-10-26 MED ORDER — ONDANSETRON HCL 4 MG PO TABS: 4.0000 mg | ORAL_TABLET | ORAL | Status: DC | PRN

## 2020-10-26 MED ORDER — PRENATAL MULTIVITAMIN CH
1.0000 | ORAL_TABLET | Freq: Every day | ORAL | Status: DC
  Administered 2020-10-27: 1 via ORAL
  Filled 2020-10-26: qty 1

## 2020-10-26 MED ORDER — ZOLPIDEM TARTRATE 5 MG PO TABS: 5.0000 mg | ORAL_TABLET | Freq: Every evening | ORAL | Status: DC | PRN

## 2020-10-26 MED ORDER — IBUPROFEN 600 MG PO TABS
600.0000 mg | ORAL_TABLET | Freq: Four times a day (QID) | ORAL | Status: DC
  Administered 2020-10-26 – 2020-10-28 (×6): 600 mg via ORAL
  Filled 2020-10-26 (×6): qty 1

## 2020-10-26 MED ORDER — SIMETHICONE 80 MG PO CHEW: 80.0000 mg | CHEWABLE_TABLET | ORAL | Status: DC | PRN

## 2020-10-26 MED ORDER — OXYTOCIN BOLUS FROM INFUSION
333.0000 mL | Freq: Once | INTRAVENOUS | Status: AC
  Administered 2020-10-26: 333 mL via INTRAVENOUS

## 2020-10-26 MED ORDER — OXYTOCIN-SODIUM CHLORIDE 30-0.9 UT/500ML-% IV SOLN
2.5000 [IU]/h | INTRAVENOUS | Status: DC
  Administered 2020-10-26: 2.5 [IU]/h via INTRAVENOUS
  Filled 2020-10-26: qty 500

## 2020-10-26 MED ORDER — SENNOSIDES-DOCUSATE SODIUM 8.6-50 MG PO TABS
2.0000 | ORAL_TABLET | ORAL | Status: DC
  Administered 2020-10-26 – 2020-10-27 (×2): 2 via ORAL
  Filled 2020-10-26 (×2): qty 2

## 2020-10-26 MED ORDER — LACTATED RINGERS IV SOLN: 500.0000 mL | INTRAVENOUS | Status: DC | PRN

## 2020-10-26 MED ORDER — ONDANSETRON HCL 4 MG/2ML IJ SOLN
4.0000 mg | INTRAMUSCULAR | Status: DC | PRN
Start: 2020-10-26 — End: 2020-10-28

## 2020-10-26 MED ORDER — LIDOCAINE HCL (PF) 1 % IJ SOLN: 30.0000 mL | INTRAMUSCULAR | Status: DC | PRN

## 2020-10-26 MED ORDER — OXYCODONE-ACETAMINOPHEN 5-325 MG PO TABS: 1.0000 | ORAL_TABLET | ORAL | Status: DC | PRN

## 2020-10-26 MED ORDER — COCONUT OIL OIL
1.0000 | TOPICAL_OIL | Status: DC | PRN
Start: 2020-10-26 — End: 2020-10-28

## 2020-10-26 MED ORDER — MISOPROSTOL 50MCG HALF TABLET
ORAL_TABLET | ORAL | Status: AC
  Administered 2020-10-26: 50 ug via BUCCAL
  Filled 2020-10-26: qty 1

## 2020-10-26 MED ORDER — SOD CITRATE-CITRIC ACID 500-334 MG/5ML PO SOLN: 30.0000 mL | ORAL | Status: DC | PRN

## 2020-10-26 MED ORDER — TETANUS-DIPHTH-ACELL PERTUSSIS 5-2.5-18.5 LF-MCG/0.5 IM SUSY: 0.5000 mL | PREFILLED_SYRINGE | Freq: Once | INTRAMUSCULAR | Status: DC

## 2020-10-26 MED ORDER — LACTATED RINGERS IV SOLN: 500.0000 mL | Freq: Once | INTRAVENOUS | Status: DC

## 2020-10-26 MED ORDER — DIPHENHYDRAMINE HCL 50 MG/ML IJ SOLN: 12.5000 mg | INTRAMUSCULAR | Status: DC | PRN

## 2020-10-26 MED ORDER — OXYCODONE HCL 5 MG PO TABS: 5.0000 mg | ORAL_TABLET | ORAL | Status: DC | PRN

## 2020-10-26 MED ORDER — LIDOCAINE HCL (PF) 1 % IJ SOLN
INTRAMUSCULAR | Status: DC | PRN
  Administered 2020-10-26: 10 mL via EPIDURAL

## 2020-10-26 MED ORDER — FENTANYL-BUPIVACAINE-NACL 0.5-0.125-0.9 MG/250ML-% EP SOLN
12.0000 mL/h | EPIDURAL | Status: DC | PRN
Start: 2020-10-26 — End: 2020-10-26
  Administered 2020-10-26: 12 mL/h via EPIDURAL
  Filled 2020-10-26: qty 250

## 2020-10-26 MED ORDER — BENZOCAINE-MENTHOL 20-0.5 % EX AERO: 1.0000 "application " | INHALATION_SPRAY | CUTANEOUS | Status: DC | PRN

## 2020-10-26 MED ORDER — MISOPROSTOL 50MCG HALF TABLET: 50.0000 ug | ORAL_TABLET | ORAL | Status: DC

## 2020-10-26 NOTE — Progress Notes (Signed)
Used for epidural placement and foley placement

## 2020-10-26 NOTE — Anesthesia Procedure Notes (Signed)
Epidural Patient location during procedure: OB Start time: 10/26/2020 6:13 PM End time: 10/26/2020 6:24 PM  Staffing Anesthesiologist: Lucretia Kern, MD Performed: anesthesiologist   Preanesthetic Checklist Completed: patient identified, IV checked, risks and benefits discussed, monitors and equipment checked, pre-op evaluation and timeout performed  Epidural Patient position: sitting Prep: DuraPrep Patient monitoring: heart rate, continuous pulse ox and blood pressure Approach: midline Location: L3-L4 Injection technique: LOR air  Needle:  Needle type: Tuohy  Needle gauge: 17 G Needle length: 9 cm Needle insertion depth: 5 cm Catheter type: closed end flexible Catheter size: 19 Gauge Catheter at skin depth: 10 cm Test dose: negative  Assessment Events: blood not aspirated, injection not painful, no injection resistance, no paresthesia and negative IV test  Additional Notes Reason for block:procedure for pain

## 2020-10-26 NOTE — Progress Notes (Signed)
Kaleea Elfadl Jirah Rider is a 30 y.o. [phone] at [redacted]w[redacted]d by admitted for induction of labor due to Poor fetal growth.  Subjective:  Coping well and comfortable with epidural.   Objective: BP 103/64   Pulse 80   Temp 97.7 F (36.5 C) (Oral)   Ht 5\' 3"  (1.6 m)   Wt 72.9 kg   LMP 02/06/2020   SpO2 100%   BMI 28.47 kg/m  No intake/output data recorded. No intake/output data recorded.  FHT:  FHR: 155 bpm, variability: moderate,  accelerations:  Present,  decelerations:  Absent UC:   regular, every 2-3 minutes SVE:   Dilation: 7 Effacement (%): 80 Station: 0 Exam by:: [email or web address] CNM  Labs: Lab Results  Component Value Date   WBC 8.3 10/26/2020   HGB 11.1 (L) 10/26/2020   HCT 32.7 (L) 10/26/2020   MCV 84.7 10/26/2020   PLT 358 10/26/2020    Assessment / Plan: IOL 2/2 IUGR 1 dose of cytotec FB out AROM Likely does not need pitocin at this time due to rapid progress. Will continue to monitor and consider pitocin PRN.   Labor: Progressing normally Preeclampsia:   NA Fetal Wellbeing:  Category I Pain Control:  Epidural I/D:  n/a Anticipated MOD:  NSVD  DW Dr. 10/28/2020 about possible de-infibulation w/ repair versus tearing and then repairing. Dr. Shawnie Pons recommends allowing for any natural tearing and repair as needed.    Shawnie Pons 10/26/2020, 6:46 PM

## 2020-10-26 NOTE — Progress Notes (Signed)
OB Note  Per Dr. Donavan Foil, [facility] called him yesterday with patient's ultrasound findings and he recommend IOL today.  I called over to [facility] and results are as follows: 2704gm, efw 8.8%, AC c/w 35/1wks, AFI 13.3, cephalic, bpp 8/8, posterior placenta  Cornelia Copa MD Attending Center for [employer] (Faculty Practice) 10/26/2020 Time: 1215pm

## 2020-10-26 NOTE — Progress Notes (Signed)
Madison Bishop is a 30 y.o. [phone] at [redacted]w[redacted]d by admitted for induction of labor due to Poor fetal growth.  Subjective:  Patient has had FB placed and one dose of cytotec.   Objective: BP 104/66   Pulse 70   Temp 97.7 F (36.5 C) (Oral)   Ht 5\' 3"  (1.6 m)   Wt 72.9 kg   LMP 02/06/2020   SpO2 100%   BMI 28.47 kg/m  No intake/output data recorded. No intake/output data recorded.  FHT:  FHR: 135 bpm, variability: moderate,  accelerations:  Present,  decelerations:  Present prolonged decelerations down to nadir of 60 x 1-2 mins x 3. This started within about 30 mins of first dose of cytotec UC:   difficult to trace due to frequent position change, but decelerations seem to coincide with contractions.  SVE:   Dilation: 1 Exam by:: H Fayelynn Distel CNM  Labs: Lab Results  Component Value Date   WBC 8.3 10/26/2020   HGB 11.1 (L) 10/26/2020   HCT 32.7 (L) 10/26/2020   MCV 84.7 10/26/2020   PLT 358 10/26/2020    Assessment / Plan: IOL, FB in place One dose of cytotec FB left in place, 1 dose of terb given and decelerations seemed to improve Dr. 10/28/2020 notified. Plan for now is to leave FB in, but not use any other IOL methods with the FB and continue to monitor.   Labor:  cervical ripening Preeclampsia:   NA Fetal Wellbeing:  Category II Pain Control:  Labor support without medications I/D:  n/a Anticipated MOD:  NSVD   Vergie Living DNP, CNM  10/26/20  2:42 PM

## 2020-10-26 NOTE — Discharge Summary (Addendum)
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 yo [phone] [id number] admitted to the hospital 10/26/2020 for induction of labor.  Indication for induction:  FGR (EFW 8th) noted on u/s at her prenatal visit 9/22 .  Patient had an uncomplicated labor course as follows: Membrane Rupture Time/Date: 6:39 PM ,10/26/2020   Delivery Method:Vaginal, Spontaneous  Episiotomy:  infibulation (cut for delivery and then repaired) Lacerations:  None  Details of delivery can be found in separate delivery note.  Patient had a routine postpartum course. Patient is discharged home 10/28/20.  Newborn Data: Birth date:10/26/2020  Birth time:8:50 PM  Gender:Female  Living status:Living  Apgars:9 ,9  Weight:2849 g (6lb 4.5oz)  Magnesium Sulfate received: No BMZ received: No Rhophylac:Yes MMR:N/A T-DaP: offered postpartum Flu: Yes Transfusion:No  Physical exam  Vitals:   10/27/20 0626 10/27/20 1639 10/27/20 2055 10/28/20 0537  BP: 99/61 (!) 94/55 94/60 96/62  Pulse: 70 61 69 76  Resp: _0 Temp: 98.1 F (36.7  C) 97.9 F (36.6 C) 98.1 F (36.7 C) 97.9 F (36.6 C)  TempSrc: Oral Oral Oral Oral  SpO2: 100% 99% 100% 100%  Weight:      Height:       General: alert, cooperative, and no distress Lochia: appropriate Uterine Fundus: firm Incision: N/A DVT Evaluation: No evidence of DVT seen on physical exam. Labs: Lab Results  Component Value Date   WBC 8.3 10/26/2020   HGB 11.1 (L) 10/26/2020   HCT 32.7 (L) 10/26/2020   MCV 84.7 10/26/2020   PLT 358 10/26/2020   CMP Latest Ref Rng & Units 12/05/2017  Glucose 70 - 99 mg/dL 106(H)  BUN 6 - 20 mg/dL 10  Creatinine 0.44 - 1.00 mg/dL 0.70  Sodium 135 - 145 mmol/L 135  Potassium 3.5 - 5.1 mmol/L 3.4(L)  Chloride 98 - 111 mmol/L 103  CO2 22 - 32 mmol/L 22  Calcium 8.9 - 10.3 mg/dL 9.2  Total Protein 6.5 - 8.1 g/dL 7.4  Total Bilirubin 0.3 - 1.2 mg/dL 0.5  Alkaline Phos 38 - 126 U/L 80  AST 15 - 41 U/L 22  ALT 0 - 44 U/L 18   Edinburgh Score: Edinburgh Postnatal Depression Scale Screening Tool 10/28/2020  I have been able to laugh and see the funny side of things. 0  I have looked forward with enjoyment to things. 0  I have blamed myself unnecessarily when things went wrong. 0  I have been  anxious or worried for no good reason. 0  I have felt scared or panicky for no good reason. 0  Things have been getting on top of me. 0  I have been so unhappy that I have had difficulty sleeping. 0  I have felt sad or miserable. 0  I have been so unhappy that I have been crying. 0  The thought of harming myself has occurred to me. 0  Edinburgh Postnatal Depression Scale Total 0     After visit meds:  Allergies as of 10/28/2020   No Known Allergies      Medication List     TAKE these medications    ibuprofen 600 MG tablet Commonly known as: ADVIL Take 1 tablet (600 mg total) by mouth every 6 (six) hours.   multivitamin-prenatal 27-0.8 MG Tabs tablet Take 1 tablet by mouth daily at 12 noon.         Discharge home in stable  condition Infant Feeding: Breast Infant Disposition:home with mother Discharge instruction: per After Visit Summary and Postpartum booklet. Activity: Advance as tolerated. Pelvic rest for 6 weeks.  Diet: routine diet Future Appointments:No future appointments. Follow up Visit:  Follow-up Information     Department, [facility]. Schedule an appointment as soon as possible for a visit in 4 week(s).   Why: For your postpartum appointment. Contact information: [address] [place] [address] [phone]                 10/28/2020 Gifford Shave, MD   Midwife attestation I have seen and examined this patient and agree with above documentation in the resident's note.   Shiana Elfadl Chalsey Leeth is a 30 y.o. [id number] s/p SVD.  Pain is well controlled. Plan for birth control is cervical cap. Method of Feeding: Nexplanon-placed IP  PE:  Gen: well appearing Heart: reg rate Lungs: normal WOB Fundus firm Ext: no pain, no edema  Recent Labs    10/26/20 1246  HGB 11.1*  HCT 32.7*    Assessment S/p SVD PPD # 2  Plan: - discharge home - postpartum care discussed - f/u at [facility] in 6 weeks for postpartum visit - f/u at [facility] for latent TB tx-message left for answering service at [employer] 10:25 AM

## 2020-10-26 NOTE — Lactation Note (Signed)
This note was copied from a baby's chart. Lactation Consultation Note  Patient Name: Madison Bishop ASNKN'L Date: 10/26/2020 Reason for consult: L&D Initial assessment Age:31 hours Preferred language is Arabic, father interpreter per mother's wishes:  L&D consult with >60 minutes old infant and P4 mother. Congratulated family on newborn.  Baby is latched upon arrival, observed deep latch, suckling and swallowing.   Discussed STS as ideal transition for infants after birth helping with temperature, blood sugar and comfort. Talked about primal reflexes such as rooting, hands to mouth, searching for the breast among others. Explained LC services availability during postpartum stay. Thanked family for their time.      Maternal Data Has patient been taught Hand Expression?: No Does the patient have breastfeeding experience prior to this delivery?: Yes How long did the patient breastfeed?: 20 months x3  LATCH Score Latch: Grasps breast easily, tongue down, lips flanged, rhythmical sucking.  Audible Swallowing: Spontaneous and intermittent  Type of Nipple: Everted at rest and after stimulation  Comfort (Breast/Nipple): Soft / non-tender  Hold (Positioning): No assistance needed to correctly position infant at breast.  LATCH Score: 10  Interventions Interventions: Skin to skin;Expressed milk;Breast feeding basics reviewed;Education  Consult Status Consult Status: Follow-up from L&D Date: 10/27/20 Follow-up type: In-patient    Alok Minshall A Higuera Ancidey 10/26/2020, 9:58 PM

## 2020-10-26 NOTE — Anesthesia Preprocedure Evaluation (Signed)

## 2020-10-27 DIAGNOSIS — O365930 Maternal care for other known or suspected poor fetal growth, third trimester, not applicable or unspecified: Secondary | ICD-10-CM | POA: Diagnosis not present

## 2020-10-27 DIAGNOSIS — Z3A38 38 weeks gestation of pregnancy: Secondary | ICD-10-CM | POA: Diagnosis not present

## 2020-10-27 LAB — RPR: RPR Ser Ql: NONREACTIVE

## 2020-10-27 MED ORDER — LIDOCAINE HCL 1 % IJ SOLN
0.0000 mL | Freq: Once | INTRAMUSCULAR | Status: AC | PRN
  Administered 2020-10-27: 10 mL via INTRADERMAL
  Filled 2020-10-27: qty 20

## 2020-10-27 MED ORDER — LIDOCAINE HCL 1 % IJ SOLN
0.0000 mL | Freq: Once | INTRAMUSCULAR | Status: DC | PRN
Start: 2020-10-27 — End: 2020-10-27

## 2020-10-27 MED ORDER — RHO D IMMUNE GLOBULIN 1500 UNIT/2ML IJ SOSY
300.0000 ug | PREFILLED_SYRINGE | Freq: Once | INTRAMUSCULAR | Status: AC
  Administered 2020-10-27: 300 ug via INTRAVENOUS
  Filled 2020-10-27: qty 2

## 2020-10-27 MED ORDER — ETONOGESTREL 68 MG ~~LOC~~ IMPL
68.0000 mg | DRUG_IMPLANT | Freq: Once | SUBCUTANEOUS | Status: AC
  Administered 2020-10-27: 68 mg via SUBCUTANEOUS
  Filled 2020-10-27: qty 1

## 2020-10-27 MED ORDER — ETONOGESTREL 68 MG ~~LOC~~ IMPL: 68.0000 mg | DRUG_IMPLANT | Freq: Once | SUBCUTANEOUS | Status: DC

## 2020-10-27 NOTE — Lactation Note (Signed)
This note was copied from a baby's chart. Lactation Consultation Note  Patient Name: Madison Bishop [id number] Date: 10/27/2020 Reason for consult: Initial assessment;Mother's request;Early term 37-38.6wks;Hyperbilirubinemia Age:31 hours Rana Arabic interpreter # [phone] Mom experienced with breastfeeding. Infant bilirubin being monitored. LC talked with Mother about increasing amounts of BM with spoon feeding after latching.   Mom declined use of pump at this time prefers to latch infant at breast. Mom aware more EBM given, more likely to lower bilirubin levels.  LC not able to see a latch at this feeding, infant just fed 30 minutes prior.  Mom nipples are round no evidence of colostrum and ample colostrum noted with hand expression.   Plan 1. To feed based on cues 8-12x 24 hr period. Mom to offer breast with compression and note signs of milk transfer.  2. Mom to supplement with EBM via spoon 5-7 ml after latching from hand expression.  3. I and O sheet reviewed  4. [facility] brochure of inpatient and outpatient services reviewed.  All questions answered at the end of the visit.   Maternal Data Has patient been taught Hand Expression?: Yes Does the patient have breastfeeding experience prior to this delivery?: Yes How long did the patient breastfeed?: 18 and 23 months  Feeding Mother's Current Feeding Choice: Breast Milk  LATCH Score                    Lactation Tools Discussed/Used    Interventions Interventions: Breast feeding basics reviewed;Breast compression;Skin to skin;Support pillows;Breast massage;Position options;Hand express;Expressed milk;Education  Discharge WIC Program: Yes  Consult Status Consult Status: Follow-up Date: 10/28/20 Follow-up type: In-patient    Johari Bennetts  Nicholson-Springer 10/27/2020, 1:11 PM

## 2020-10-27 NOTE — Progress Notes (Signed)
Patient ID: Madison Bishop, female   DOB: 1990/09/21, 30 y.o.   MRN: [id number]   GYNECOLOGY OFFICE PROCEDURE NOTE  Madison Bishop is a 30 y.o. [phone] here for Nexplanon insertion.  Patient is inpatient after a NSVD. Arabic interpretor used for entire encounter. Unable to get ID numbers due to spotty Internet connection, but was able to connect with 2 different arabic interpretors to complete procedure.   Nexplanon Insertion Procedure Patient identified, informed consent performed, consent signed.   Patient does understand that irregular bleeding is a very common side effect of this medication. She was advised to have backup contraception for one week after placement. Pregnancy test in clinic today was negative.  Appropriate time out taken.  Patient's left arm was prepped and draped in the usual sterile fashion. The ruler used to measure and mark insertion area.  Patient was prepped with alcohol swab and then injected with 3 ml of 1% lidocaine.  She was prepped with betadine, Nexplanon removed from packaging,  Device confirmed in needle, then inserted full length of needle and withdrawn per handbook instructions. Nexplanon was able to palpated in the patient's arm; patient palpated the insert herself. There was minimal blood loss.  Patient insertion site covered with guaze and a pressure bandage to reduce any bruising.  The patient tolerated the procedure well and was given post procedure instructions.   Thressa Sheller DNP, CNM  10/27/20  2:59 PM

## 2020-10-27 NOTE — Anesthesia Postprocedure Evaluation (Signed)
Anesthesia Post Note  Patient: Madison Bishop  Procedure(s) Performed: AN AD HOC LABOR EPIDURAL     Patient location during evaluation: Mother Baby Anesthesia Type: Epidural Level of consciousness: awake and alert Pain management: pain level controlled Vital Signs Assessment: post-procedure vital signs reviewed and stable Respiratory status: spontaneous breathing, nonlabored ventilation and respiratory function stable Cardiovascular status: stable Postop Assessment: no headache, no backache and epidural receding Anesthetic complications: no   No notable events documented.  Last Vitals:  Vitals:   10/27/20 0230 10/27/20 0626  BP: (!) 96/57 99/61  Pulse: 66 70  Resp: 18 18  Temp: 36.8 C 36.7 C  SpO2: 100% 100%    Last Pain:  Vitals:   10/27/20 1017  TempSrc:   PainSc: 0-No pain   Pain Goal:                Epidural/Spinal Function Cutaneous sensation: Normal sensation (10/27/20 1017)  [facility] Records

## 2020-10-27 NOTE — Progress Notes (Signed)
POSTPARTUM PROGRESS NOTE  Post Partum Day 1  Subjective:  Madison Bishop is a 30 y.o. [id number] s/p VD at [redacted]w[redacted]d.  She reports she is doing well. No acute events overnight. She denies any problems with ambulating, voiding or po intake. Denies nausea or vomiting.  Pain is well controlled.  Lochia is appropriate.  Objective: Blood pressure 99/61, pulse 70, temperature 98.1 F (36.7 C), temperature source Oral, resp. rate 18, height 5\' 3"  (1.6 m), weight 72.9 kg, last menstrual period 02/06/2020, SpO2 100 %, unknown if currently breastfeeding.  Physical Exam:  General: alert, cooperative and no distress Chest: no respiratory distress Heart:regular rate, distal pulses intact Abdomen: soft, nontender,  Uterine Fundus: firm, appropriately tender DVT Evaluation: No calf swelling or tenderness Extremities: no edema Skin: warm, dry  Recent Labs    10/26/20 1246  HGB 11.1*  HCT 32.7*    Assessment/Plan: Madison Bishop is a 30 y.o. 26 s/p VD at [redacted]w[redacted]d   PPD#1 - Doing well  Routine postpartum care Baby with B pos blood. Patient will need Rhogam within 48 hours of delivery  Contraception: Nexplanon Feeding: breast  Dispo: Plan for discharge tomorrow .   LOS: 1 day   [redacted]w[redacted]d, MD  PGY-3, [facility]  10/27/2020, 10:33 AM

## 2020-10-28 DIAGNOSIS — Z3A38 38 weeks gestation of pregnancy: Secondary | ICD-10-CM | POA: Diagnosis not present

## 2020-10-28 DIAGNOSIS — O365930 Maternal care for other known or suspected poor fetal growth, third trimester, not applicable or unspecified: Secondary | ICD-10-CM | POA: Diagnosis not present

## 2020-10-28 LAB — RH IG WORKUP (INCLUDES ABO/RH)
Fetal Screen: NEGATIVE
Gestational Age(Wks): 38.2
Unit division: 0

## 2020-10-28 NOTE — Progress Notes (Signed)
Discharge teaching completed with interpreter [phone].

## 2020-10-28 NOTE — Lactation Note (Signed)
This note was copied from a baby's chart. Lactation Consultation Note  Patient Name: [name] HHIDU'P Date: 10/28/2020 Reason for consult: Follow-up assessment Age:31 hours   P4 mother whose infant is now 45 hours old.  This is an ETI at 38+2 weeks.  Mother breast fed her other three children for 20 months each.  Arabic interpreter [phone]) used for interpretation.  A second interpreter [phone]) used after disconnection from first interpreter.  Mother reports no concerns with breast feeding.  She is latching and feeding well. Last LATCH score 10; baby is voiding/stooling well.  Family is awaiting a lab result for bilirubin to determine discharge.  Mother has a DEBP for home use and our OP phone number for any further questions/concerns.  Father present.  RN called into room at the end of my visit so she can complete the mother's discharge.    Maternal Data    Feeding    LATCH Score                    Lactation Tools Discussed/Used    Interventions Interventions: Education  Discharge Discharge Education: Engorgement and breast care  Consult Status Consult Status: Complete Date: 10/28/20 Follow-up type: Call as needed    Kamyrah Feeser R Tiyona Desouza 10/28/2020, 11:27 AM

## 2020-11-06 ENCOUNTER — Telehealth (HOSPITAL_COMMUNITY): Payer: Self-pay | Admitting: *Deleted

## 2020-11-06 NOTE — Telephone Encounter (Signed)
Attempted Hospital Discharge Follow-Up Call with help of telephonic interpreter "[phone]".  No answer at either number listed in patient's chart.

## 2021-12-06 IMAGING — CR DG CHEST 1V
1 series · 1 of 1 positions shown · non-contrast
Comparison: July 30, 2018

CLINICAL DATA: Positive PPD test.

EXAM:
CHEST  1 VIEW

[w chest pa]
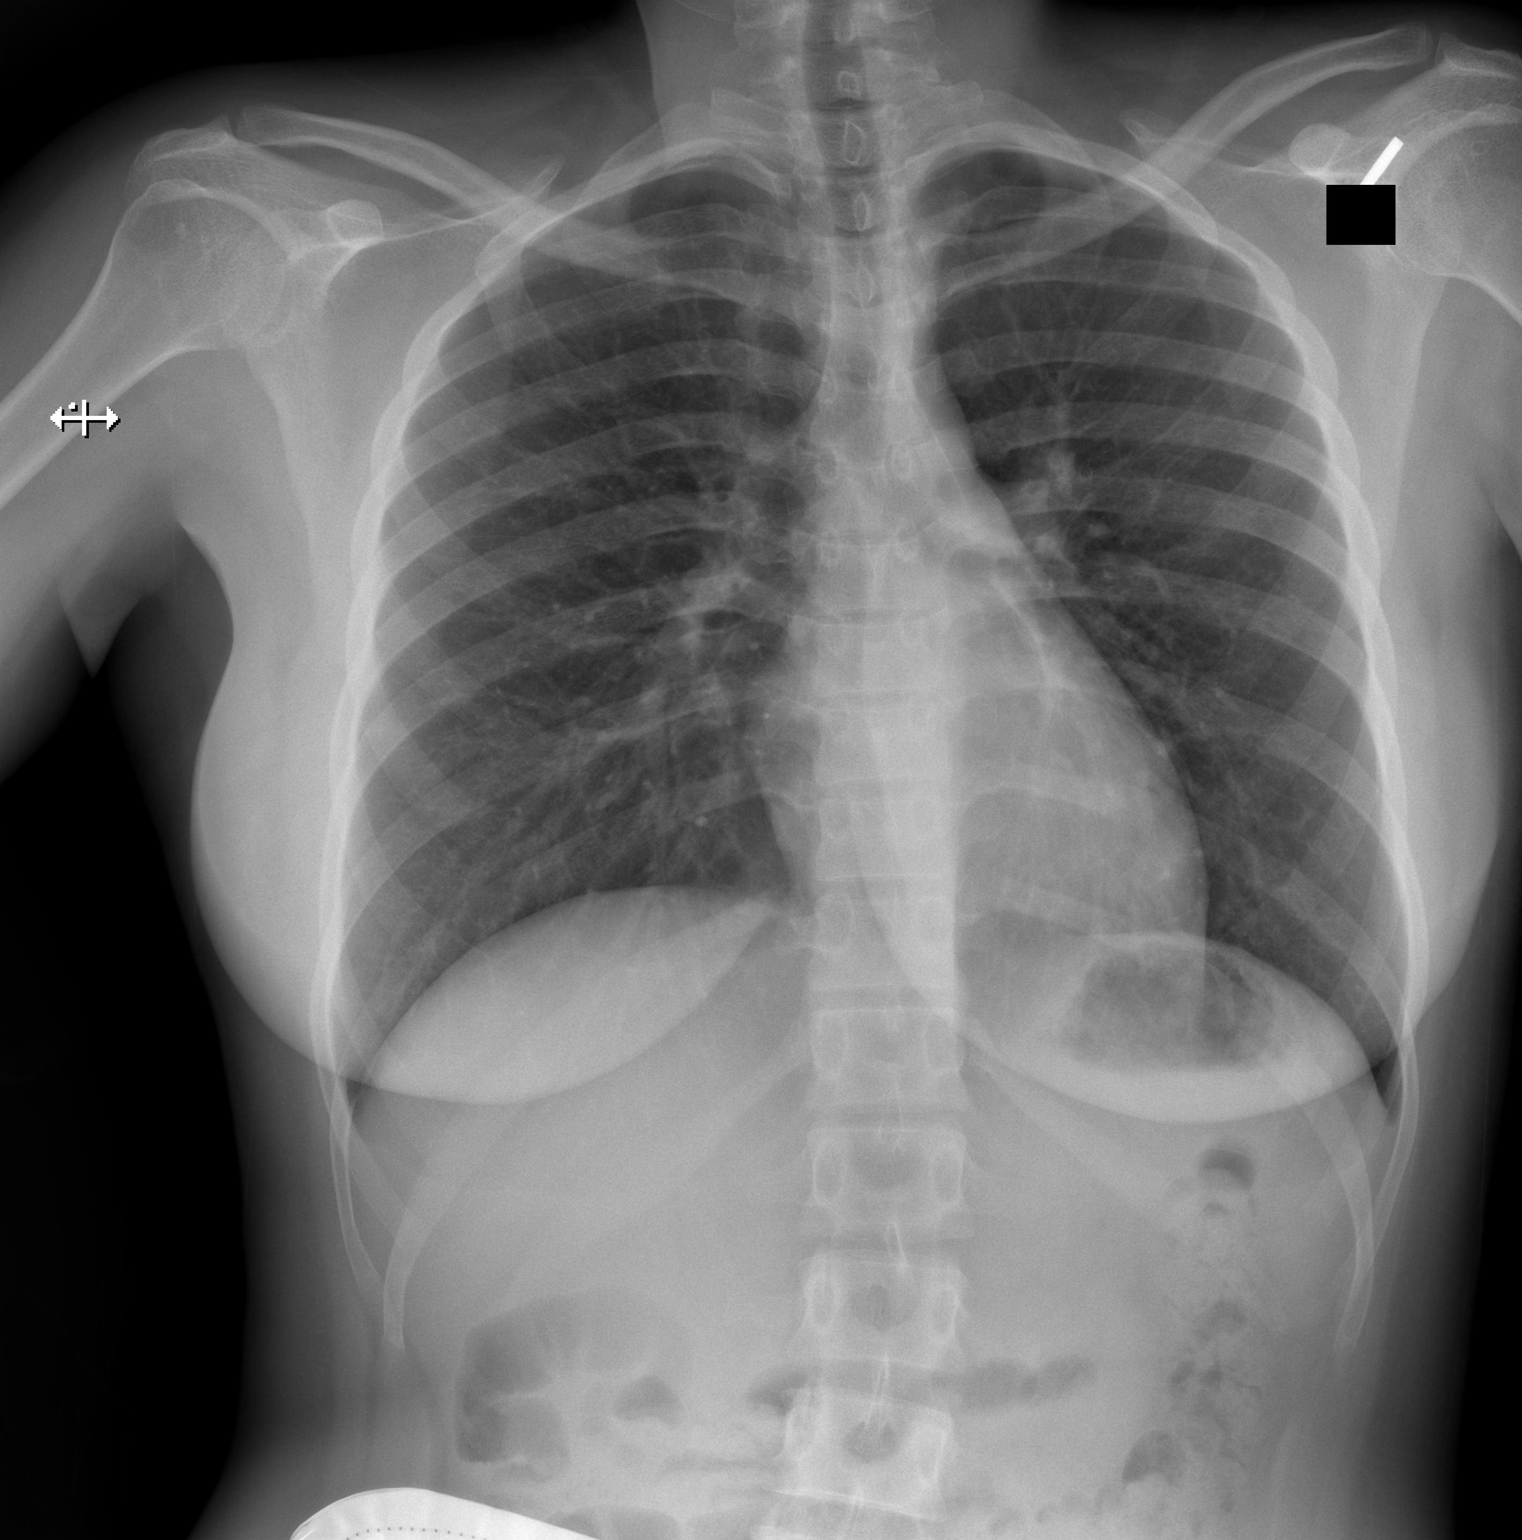

[1 of 1 positions shown; findings below may reference images not displayed]

FINDINGS: The heart size and mediastinal contours are within normal limits.
Both lungs are clear. The visualized skeletal structures are
unremarkable.
IMPRESSION: No active disease.

## 2023-01-24 ENCOUNTER — Encounter (HOSPITAL_COMMUNITY): Payer: Self-pay | Admitting: Emergency Medicine

## 2023-01-24 ENCOUNTER — Ambulatory Visit (HOSPITAL_COMMUNITY)
Admission: EM | Admit: 2023-01-24 | Discharge: 2023-01-24 | Disposition: A | Discharge status: Home / Self Care | Payer: Medicaid Other | Attending: Family Medicine | Admitting: Family Medicine

## 2023-01-24 DIAGNOSIS — H6992 Unspecified Eustachian tube disorder, left ear: Secondary | ICD-10-CM

## 2023-01-24 DIAGNOSIS — H9202 Otalgia, left ear: Secondary | ICD-10-CM | POA: Diagnosis not present

## 2023-01-24 MED ORDER — FLUTICASONE PROPIONATE 50 MCG/ACT NA SUSP: 2.0000 | Freq: Every day | NASAL | 0 refills | Status: AC

## 2023-01-24 MED ORDER — PSEUDOEPHEDRINE HCL 30 MG PO TABS: 30.0000 mg | ORAL_TABLET | Freq: Three times a day (TID) | ORAL | 0 refills | Status: AC

## 2023-01-24 NOTE — ED Provider Notes (Signed)
[facility]    CSN: [id number] Arrival date & time: 01/24/23  1251      History   Chief Complaint Chief Complaint  Patient presents with   Otalgia    HPI Madison Bishop is a 32 y.o. female.    Otalgia Here for mild left ear pain and decreased hearing in the left ear, for about 2 weeks.  No fever or cough/congestion/sore throat.  LMP 12/1  NKDA   Past Medical History:  Diagnosis Date   Low maternal weight gain 01/08/2017   Medical history non-contributory    Normal labor 01/21/2017   Pneumonia    Rh negative, antepartum 07/21/2016   Rhogam at 28 weeks   Seasonal allergies    SVD (spontaneous vaginal delivery) 01/22/2017    Patient Active Problem List   Diagnosis Date Noted   IUGR (intrauterine growth restriction) affecting care of mother 10/26/2020   Limited prenatal care 10/26/2020   Normal labor 12/18/2018   Susceptible to varicella (non-immune), currently pregnant 07/28/2016   Rh negative, antepartum 07/21/2016   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 Authorizing Provider  fluticasone (FLONASE) 50 MCG/ACT nasal spray Place 2 sprays into both nostrils daily. 01/24/23  Yes Zenia Resides, MD  pseudoephedrine (SUDAFED) 30 MG tablet Take 1 tablet (30 mg total) by mouth every 8 (eight) hours. 01/24/23  Yes Zenia Resides, MD    Family History History reviewed. No pertinent family history.  Social History Social History   Tobacco Use   Smoking status: Never   Smokeless tobacco: Never  Vaping Use   Vaping status: Never Used   Substance Use Topics   Alcohol use: Never   Drug use: Never     Allergies   Patient has no known allergies.   Review of Systems Review of Systems  HENT:  Positive for ear pain.      Physical Exam Triage Vital Signs ED Triage Vitals  Encounter Vitals Group     BP 01/24/23 1309 126/80     Systolic BP Percentile --      Diastolic BP Percentile --      Pulse Rate 01/24/23 1309 69     Resp 01/24/23 1309 17     Temp 01/24/23 1309 97.8 F (36.6 C)     Temp Source 01/24/23 1309 Oral     SpO2 01/24/23 1309 99 %     Weight --      Height --      Head Circumference --      Peak Flow --      Pain Score 01/24/23 1308 4     Pain Loc --      Pain Education --      Exclude from Growth Chart --    No data found.  Updated Vital Signs BP 126/80 (  BP Location: Right Arm)   Pulse 69   Temp 97.8 F (36.6 C) (Oral)   Resp 17   LMP 01/04/2023 (Approximate)   SpO2 99%   Breastfeeding No   Visual Acuity Right Eye Distance:   Left Eye Distance:   Bilateral Distance:    Right Eye Near:   Left Eye Near:    Bilateral Near:     Physical Exam Vitals reviewed.  Constitutional:      General: She is not in acute distress.    Appearance: She is not toxic-appearing.  HENT:     Right Ear: Tympanic membrane and ear canal normal.     Left Ear: Ear canal normal.     Ears:     Comments: There is not wax in the canal. The left TM is translucent and gray, with wrinkly appearance anteriorly. No air fluid level seen.    Mouth/Throat:     Mouth: Mucous membranes are moist.     Pharynx: No oropharyngeal exudate or posterior oropharyngeal erythema.  Eyes:     Extraocular Movements: Extraocular movements intact.     Conjunctiva/sclera: Conjunctivae normal.     Pupils: Pupils are equal, round, and reactive to light.  Musculoskeletal:     Cervical back: Neck supple.  Lymphadenopathy:     Cervical: No cervical adenopathy.  Skin:    Coloration: Skin is not jaundiced or pale.   Neurological:     Mental Status: She is alert and oriented to person, place, and time.  Psychiatric:        Behavior: Behavior normal.      UC Treatments / Results  Labs (all labs ordered are listed, but only abnormal results are displayed) Labs Reviewed - No data to display  EKG   Radiology No results found.  Procedures Procedures (including critical care time)  Medications Ordered in UC Medications - No data to display  Initial Impression / Assessment and Plan / UC Course  I have reviewed the triage vital signs and the nursing notes.  Pertinent labs & imaging results that were available during my care of the patient were reviewed by me and considered in my medical decision making (see chart for details).      Visit conducted with the help of video interpretation in Arabic. Sudafed and flonase sent to the pharmacy to help what appears to be eustachian tube dysfunction. She is giving instruction on how to set up pcp appt on our website. Final Clinical Impressions(s) / UC Diagnoses   Final diagnoses:  Left ear pain  Dysfunction of left eustachian tube     Discharge Instructions      Fluticasone/Flonase nose spray--put 2 sprays in each nostril once daily. ???? ????? ??????????/??????: ?? ????? ?? ?? ???? ??? ??? ????? ??????  Pseudoephedrine 30 mg--1 tablet every 8 hours.  ?????????????? 30 ??? - ??? ???? ?? 8 ?????  These medicines are meant to help the eustachian tube become unblocked.  ???? ??? ??????? ??? ???????? ?? ??? ???? ????????.  You can use the QR code/website at the back of the summary paperwork to schedule yourself a new patient appointment with primary care.  ????? ??????? ??? ????????? ???????/???? ????? ??????? ?? ????? ?????? ?? ???? ??????? ?????? ???? ???? ?????? ?? ??????? ???????      ED Prescriptions     Medication Sig Dispense Auth. Provider   pseudoephedrine (SUDAFED) 30 MG tablet Take 1 tablet (30 mg total) by mouth every 8  (eight) hours. 30 tablet [address]  K, MD   fluticasone (FLONASE) 50 MCG/ACT nasal spray Place 2 sprays into both nostrils daily. 16 g Zenia Resides, MD      PDMP not reviewed this encounter.   Zenia Resides, MD 01/24/23 [phone]

## 2023-01-24 NOTE — ED Triage Notes (Signed)
Pt c/o pain in left ear for 2 weeks.

## 2023-01-24 NOTE — ED Notes (Signed)
Madison Bishop #[id number]

## 2023-01-24 NOTE — Discharge Instructions (Signed)
Fluticasone/Flonase nose spray--put 2 sprays in each nostril once daily. ???? ????? ??????????/??????: ?? ????? ?? ?? ???? ??? ??? ????? ??????  Pseudoephedrine 30 mg--1 tablet every 8 hours.  ?????????????? 30 ??? - ??? ???? ?? 8 ?????  These medicines are meant to help the eustachian tube become unblocked.  ???? ??? ??????? ??? ???????? ?? ??? ???? ????????.  You can use the QR code/website at the back of the summary paperwork to schedule yourself a new patient appointment with primary care.  ????? ??????? ??? ????????? ???????/???? ????? ??????? ?? ????? ?????? ?? ???? ??????? ?????? ???? ???? ?????? ?? ??????? ???????
